# Patient Record
Sex: Male | Born: 1970
Health system: Southern US, Community
[De-identification: ages and names within clinical notes are randomized; demographics above are authoritative.]

## PROBLEM LIST (undated history)

## (undated) DIAGNOSIS — I10 Essential (primary) hypertension: Secondary | ICD-10-CM

## (undated) HISTORY — DX: Essential (primary) hypertension: I10

---

## 2010-06-06 ENCOUNTER — Encounter: Payer: Self-pay | Admitting: Emergency Medicine

## 2010-06-06 ENCOUNTER — Inpatient Hospital Stay (INDEPENDENT_AMBULATORY_CARE_PROVIDER_SITE_OTHER)
Admission: RE | Admit: 2010-06-06 | Discharge: 2010-06-06 | Disposition: A | Discharge status: Home / Self Care | Payer: 59 | Source: Ambulatory Visit | Attending: Emergency Medicine | Admitting: Emergency Medicine

## 2010-06-06 DIAGNOSIS — I10 Essential (primary) hypertension: Secondary | ICD-10-CM | POA: Insufficient documentation

## 2010-06-06 DIAGNOSIS — J069 Acute upper respiratory infection, unspecified: Secondary | ICD-10-CM

## 2010-06-06 LAB — CONVERTED CEMR LAB: Rapid Strep: NEGATIVE

## 2010-09-12 ENCOUNTER — Encounter: Payer: Self-pay | Admitting: Family Medicine

## 2010-09-15 ENCOUNTER — Ambulatory Visit (INDEPENDENT_AMBULATORY_CARE_PROVIDER_SITE_OTHER): Payer: 59 | Admitting: Family Medicine

## 2010-09-15 ENCOUNTER — Encounter: Payer: Self-pay | Admitting: Family Medicine

## 2010-09-15 VITALS — BP 160/102 | HR 79 | Ht 69.0 in | Wt 255.0 lb

## 2010-09-15 DIAGNOSIS — I10 Essential (primary) hypertension: Secondary | ICD-10-CM

## 2010-09-15 MED ORDER — BENZONATATE 200 MG PO CAPS: 200.0000 mg | ORAL_CAPSULE | Freq: Three times a day (TID) | ORAL | Status: AC | PRN

## 2010-09-15 MED ORDER — LISINOPRIL-HYDROCHLOROTHIAZIDE 10-12.5 MG PO TABS: 1.0000 | ORAL_TABLET | Freq: Every day | ORAL | Status: DC

## 2010-09-15 NOTE — Progress Notes (Signed)
Addended by: Nani Gasser D on: 09/15/2010 04:49 PM   Modules accepted: Orders

## 2010-09-15 NOTE — Assessment & Plan Note (Addendum)
New Dx.  Discussed dietary changes, exercise. Starting BP med. Warned of potential SE. Discussed DASH diet .org f/u in 1 months. Due for labs to make sure normal renal function, thyroid etc. Also need to eval chol for other risk factors.

## 2010-09-15 NOTE — Progress Notes (Signed)
  Subjective:    Patient ID: David Cole, male    DOB: [birth year], 40 y.o.   MRN: [id number]  HPI BP was up recently at [facility].  Has gained about 20 lbs in the last year. No family hx of HTN.  No HA, light headedness or dizziness. Was sick last week and did have HA then and took some Aleve.  Was taking nyquail at that time. Was on meds in the past for HTN when first moved here.  Overall feels his URI is better. Does still have a tickle in his throat and still has a slight cough. Will occ noticed a wheeze in his throat.  He has been going to school at night. Has been eating more salt last week.    Review of Systems  Constitutional: Negative for fever, diaphoresis and unexpected weight change.  HENT: Negative for hearing loss, rhinorrhea, sneezing and tinnitus.   Eyes: Negative for visual disturbance.  Respiratory: Negative for cough and wheezing.   Cardiovascular: Negative for chest pain and palpitations.  Gastrointestinal: Negative for nausea, vomiting, diarrhea and blood in stool.  Genitourinary: Negative for dysuria and discharge.  Musculoskeletal: Negative for myalgias and arthralgias.  Skin: Negative for rash.  Neurological: Negative for headaches.  Hematological: Negative for adenopathy.  Psychiatric/Behavioral: Negative for sleep disturbance and dysphoric mood. The patient is not nervous/anxious.      BP 160/102  Pulse 79  Ht 5\' 9"  (1.753 m)  Wt 255 lb (115.667 kg)  BMI 37.66 kg/m2    No Known Allergies  Past Medical History  Diagnosis Date  . Hypertension     No past surgical history on file.  History   Social History  . Marital Status: Married    Spouse Name: [name]    Number of Children: 1  . Years of Education: N/A   Occupational History  . Billing specialist [employer]   Social History Main Topics  . Smoking status: Never Smoker   . Smokeless tobacco: Not on file  . Alcohol Use: 0.0 oz/week    1-3 drink(s) per week     per week  . Drug  Use: No  . Sexually Active:    Other Topics Concern  . Not on file   Social History Narrative   1 caffeine drink per day. Some college     Family History  Problem Relation Age of Onset  . Alcohol abuse      GF    Current outpatient prescriptions:lisinopril-hydrochlorothiazide (PRINZIDE,ZESTORETIC) 10-12.5 MG per tablet, Take 1 tablet by mouth daily., Disp: 30 tablet, Rfl: 1     Objective:   Physical Exam  Constitutional: He is oriented to person, place, and time. He appears well-developed and well-nourished.  HENT:  Head: Normocephalic and atraumatic.  Cardiovascular: Normal rate and normal heart sounds.        No carotid bruits.   Pulmonary/Chest: Effort normal and breath sounds normal.  Musculoskeletal: He exhibits no edema.  Neurological: He is alert and oriented to person, place, and time.  Skin: Skin is warm and dry.  Psychiatric: He has a normal mood and affect. His behavior is normal.          Assessment & Plan:

## 2010-09-16 ENCOUNTER — Telehealth: Payer: Self-pay | Admitting: Family Medicine

## 2010-09-16 LAB — COMPLETE METABOLIC PANEL WITH GFR
Albumin: 4.6 g/dL (ref 3.5–5.2)
Alkaline Phosphatase: 83 U/L (ref 39–117)
CO2: 26 mEq/L (ref 19–32)
GFR, Est Non African American: >60 mL/min (ref >60)
Glucose, Bld: 82 mg/dL (ref 70–99)
Potassium: 4.2 mEq/L (ref 3.5–5.3)
Sodium: 138 mEq/L (ref 135–145)
Total Protein: 7.8 g/dL (ref 6.0–8.3)

## 2010-09-16 LAB — LIPID PANEL
LDL Cholesterol: 129 mg/dL — ABNORMAL HIGH (ref 0–99)
Total CHOL/HDL Ratio: 6.3 Ratio

## 2010-09-16 LAB — TSH: TSH: 1.643 u[IU]/mL (ref 0.350–4.500)

## 2010-09-16 NOTE — Telephone Encounter (Signed)
Call patient: Thyroid is normal. The liver enzymes is borderline I would recheck that in one month. Cholesterol is elevated and triglycerides are high as well. Lipitor, Effexor diet, weight loss and regular exercise and recheck cholesterol in 6 months. We can recheck her liver at that time as well.

## 2010-09-17 NOTE — Telephone Encounter (Signed)
Pt informed of his recent lab results, and important details given regarding them. Jarvis Newcomer, LPN [facility] D[facility]

## 2010-10-13 ENCOUNTER — Ambulatory Visit (INDEPENDENT_AMBULATORY_CARE_PROVIDER_SITE_OTHER): Payer: 59 | Admitting: Family Medicine

## 2010-10-13 ENCOUNTER — Encounter: Payer: Self-pay | Admitting: Family Medicine

## 2010-10-13 VITALS — BP 143/91 | HR 108 | Ht 69.0 in | Wt 253.0 lb

## 2010-10-13 DIAGNOSIS — R7989 Other specified abnormal findings of blood chemistry: Secondary | ICD-10-CM

## 2010-10-13 DIAGNOSIS — I10 Essential (primary) hypertension: Secondary | ICD-10-CM

## 2010-10-13 DIAGNOSIS — J029 Acute pharyngitis, unspecified: Secondary | ICD-10-CM

## 2010-10-13 DIAGNOSIS — E785 Hyperlipidemia, unspecified: Secondary | ICD-10-CM | POA: Insufficient documentation

## 2010-10-13 DIAGNOSIS — R945 Abnormal results of liver function studies: Secondary | ICD-10-CM

## 2010-10-13 MED ORDER — LISINOPRIL-HYDROCHLOROTHIAZIDE 20-12.5 MG PO TABS: 1.0000 | ORAL_TABLET | Freq: Every day | ORAL | Status: DC

## 2010-10-13 NOTE — Assessment & Plan Note (Signed)
Much improved. He still on the medication. He has noticed some increased urinary frequency with a diuretic. There he says at this point in time is not bothersome. We'll increase the lisinopril component to 20 mg and followup in 2 months.

## 2010-10-13 NOTE — Assessment & Plan Note (Signed)
I really want him to work on exercise and diet. He is already lost a couple pounds which I congratulated him on. I want to recheck this in about 5 months.

## 2010-10-13 NOTE — Progress Notes (Addendum)
  Subjective:    Patient ID: David Cole, male    DOB: [birth year], 40 y.o.   MRN: [id number]  Hypertension This is a chronic problem. The current episode started 1 to 4 weeks ago. The problem is uncontrolled. Pertinent negatives include no chest pain. There are no associated agents to hypertension. Past treatments include ACE inhibitors and diuretics. Compliance problems include exercise.    He also complains of mild sore throat today. He's noticed for last several days. No other URI symptoms. No fever. No cough. He did notice a white spot on his right tonsil he would like me to look at.   Review of Systems  Cardiovascular: Negative for chest pain.       Objective:   Physical Exam  Constitutional: He is oriented to person, place, and time. He appears well-developed and well-nourished.       He is an obese  HENT:  Head: Normocephalic and atraumatic.  Cardiovascular: Normal rate, regular rhythm and normal heart sounds.   Pulmonary/Chest: Effort normal and breath sounds normal.  Neurological: He is alert and oriented to person, place, and time.  Skin: Skin is warm and dry.  Psychiatric: He has a normal mood and affect. His behavior is normal.   oropharynx-mucous membranes are moist, no erythema. He does have a tonsillar stone on the right.        Assessment & Plan:  I also reviewed his lab work with him. He did have an elevated liver enzyme and he want to repeat that. He was given a lab slip today to go sometime in the next 30 days.  Mild sore throat-likely viral or allergic. He has no other constitutional symptoms. We will monitor this over the next week and I recommended symptomatic care including lozenges and zinc lozenges. He has had a tonsillar stone we discussed exactly what this is. The tonsil does not appear to be red or inflamed around the tonsillar stone. He consider using a water pick to help remove these if he gets them repeatedly. Otherwise I do not recommend trying to pick  up the tonsil to remove them as this can trigger an infection.

## 2010-10-14 NOTE — Progress Notes (Signed)
Addended by: Nani Gasser D on: 10/14/2010 07:24 AM   Modules accepted: Level of Service

## 2010-12-23 ENCOUNTER — Ambulatory Visit: Payer: 59 | Admitting: Family Medicine

## 2010-12-23 DIAGNOSIS — Z0289 Encounter for other administrative examinations: Secondary | ICD-10-CM

## 2011-01-01 ENCOUNTER — Telehealth: Payer: Self-pay | Admitting: Family Medicine

## 2011-01-01 MED ORDER — LISINOPRIL-HYDROCHLOROTHIAZIDE 20-12.5 MG PO TABS: 1.0000 | ORAL_TABLET | Freq: Every day | ORAL | Status: DC

## 2011-01-01 NOTE — Telephone Encounter (Signed)
Patient walk-in request to get a blood pressure med refill sent to [facility] in [place] on [address]

## 2011-01-15 ENCOUNTER — Encounter: Payer: Self-pay | Admitting: Family Medicine

## 2011-01-15 ENCOUNTER — Ambulatory Visit (INDEPENDENT_AMBULATORY_CARE_PROVIDER_SITE_OTHER): Payer: 59 | Admitting: Family Medicine

## 2011-01-15 DIAGNOSIS — I10 Essential (primary) hypertension: Secondary | ICD-10-CM

## 2011-01-15 MED ORDER — LISINOPRIL-HYDROCHLOROTHIAZIDE 20-12.5 MG PO TABS: 1.0000 | ORAL_TABLET | Freq: Two times a day (BID) | ORAL | Status: DC

## 2011-01-15 NOTE — Progress Notes (Signed)
  Subjective:    Patient ID: David Cole, male    DOB: 01/27/1971, 40 y.o.   MRN: [id number]  Hypertension This is a chronic problem. The current episode started more than 1 year ago. The problem is unchanged. Pertinent negatives include no chest pain, palpitations or shortness of breath. There are no associated agents to hypertension. Past treatments include ACE inhibitors and diuretics. The current treatment provides moderate improvement. There are no compliance problems.       Review of Systems  Respiratory: Negative for shortness of breath.   Cardiovascular: Negative for chest pain and palpitations.       Objective:   Physical Exam  Constitutional: He is oriented to person, place, and time. He appears well-developed and well-nourished.  HENT:  Head: Normocephalic and atraumatic.  Cardiovascular: Normal rate, regular rhythm and normal heart sounds.   Pulmonary/Chest: Effort normal and breath sounds normal.  Neurological: He is alert and oriented to person, place, and time.  Skin: Skin is warm and dry.  Psychiatric: He has a normal mood and affect. His behavior is normal.          Assessment & Plan:  Hypertension-still not well controlled today. Repeat blood pressure was slightly better. But his blood pressure is also borderline at his last office visit. I did congratulate him on his weight loss. He has lost 3 more pounds. I encouraged him to keep going and try to get at least down to the low 200s with his weight. I also reminded him to make sure he is eating a low salt diet and getting regular exercise. We will increase his lisinopril HCTZ to twice a day. Followup in one month to make sure that he is at goal. I would have printed him a new prescription today.

## 2011-02-02 NOTE — Progress Notes (Signed)
Summary: CONGESTION,FEVER/WB rm 5   Vital Signs:  Patient Profile:   40 Years Old Male CC:      congestion, fever  Height:     69 inches Weight:      255.25 pounds O2 Sat:      97 % O2 treatment:    Room Air Temp:     99.0 degrees F oral Pulse rate:   91 / minute Resp:     18 per minute BP standing:   178 / 119  (left arm) Cuff size:   large  Vitals Entered By: Clemens Catholic LPN (June 05, 1608 9:15 AM)                  Updated Prior Medication List: No Medications Current Allergies: No known allergies History of Present Illness History from: patient Chief Complaint: congestion, fever  History of Present Illness: 40 Years Old Male complains of onset of cold symptoms for 2 days.  David Cole has been using nothing OTC. +sore throat + cough No pleuritic pain No wheezing + nasal congestion + post-nasal drainage + sinus pain/pressure + chest congestion + itchy/red eyes + earache No hemoptysis No SOB No chills/sweats + fever No nausea No vomiting No abdominal pain No diarrhea No skin rashes No fatigue No myalgias No headache   REVIEW OF SYSTEMS Constitutional Symptoms       Complains of fever, chills, and night sweats.     Denies weight loss, weight gain, and fatigue.  Eyes       Complains of eye pain and glasses.      Denies change in vision, eye discharge, contact lenses, and eye surgery. Ear/Nose/Throat/Mouth       Complains of ear pain, sore throat, and hoarseness.      Denies hearing loss/aids, change in hearing, ear discharge, dizziness, frequent runny nose, frequent nose bleeds, sinus problems, and tooth pain or bleeding.  Respiratory       Complains of productive cough and wheezing.      Denies dry cough, shortness of breath, asthma, bronchitis, and emphysema/COPD.  Cardiovascular       Complains of chest pain.      Denies murmurs and tires easily with exhertion.    Gastrointestinal       Denies stomach pain, nausea/vomiting, diarrhea,  constipation, blood in bowel movements, and indigestion. Genitourniary       Denies painful urination, kidney stones, and loss of urinary control. Neurological       Denies paralysis, seizures, and fainting/blackouts. Musculoskeletal       Denies muscle pain, joint pain, joint stiffness, decreased range of motion, redness, swelling, muscle weakness, and gout.  Skin       Denies bruising, unusual mles/lumps or sores, and hair/skin or nail changes.  Psych       Denies mood changes, temper/anger issues, anxiety/stress, speech problems, depression, and sleep problems. Other Comments: pt c/o productive cough(green phlegm), RT ear ache, and fever (up to 101.8 per pt) x 2days. he has taken OTC Nyquil and Aleve with no relief.   Past History:  Past Medical History: Hypertension  Past Surgical History: Denies surgical history  Family History: none  Social History: Never Smoked Alcohol use-yes 1-3 drinks per wk Drug use-no Smoking Status:  never Drug Use:  no Physical Exam General appearance: well developed, well nourished, no acute distress Ears: normal, no lesions or deformities Nasal: clear discharge Oral/Pharynx: clear PND, no exudate, no erythema Neck: neck supple,  trachea midline, no  masses Chest/Lungs: no rales, wheezes, or rhonchi bilateral, breath sounds equal without effort Heart: regular rate and  rhythm, no murmur MSE: oriented to time, place, and person Assessment New Problems: UPPER RESPIRATORY INFECTION, ACUTE (ICD-465.9) HYPERTENSION (ICD-401.9)   Plan New Medications/Changes: ZITHROMAX Z-PAK 250 MG TABS (AZITHROMYCIN) use as directed  #1 x 0, 06/06/2010, Hoyt Koch MD  New Orders: New Patient Level III [phone] Pulse Oximetry (single measurment) [94760] T-Culture, Throat [[id number]] Rapid Strep [86578] Planning Comments:   1)  Hold antibiotic for a few days prior to taking since likely viral. Rapid strep negative.  Culture pending. 2)  Use  nasal saline solution (over the counter) at least 3 times a day. 3)  Use over the counter Zyrtec  4)  Can take tylenol every 6 hours or motrin every 8 hours for pain or fever. 5)  Follow up with your primary doctor  if no improvement in 5-7 days, sooner if increasing pain, fever, or new symptoms.   Caution with your HTN.  You need to follow up with your PCP next week to discuss your blood pressure.  ER precautions given.   The patient and/or caregiver has been counseled thoroughly with regard to medications prescribed including dosage, schedule, interactions, rationale for use, and possible side effects and they verbalize understanding.  Diagnoses and expected course of recovery discussed and will return if not improved as expected or if the condition worsens. Patient and/or caregiver verbalized understanding.  Prescriptions: ZITHROMAX Z-PAK 250 MG TABS (AZITHROMYCIN) use as directed  #1 x 0   Entered and Authorized by:   Hoyt Koch MD   Signed by:   Hoyt Koch MD on 06/06/2010   Method used:   Print then Give to Patient   RxID:   [id number]   Orders Added: 1)  New Patient Level III [10272] 2)  Pulse Oximetry (single measurment) [94760] 3)  T-Culture, Throat [[id number]] 4)  Rapid Strep [42595]    Laboratory Results  Date/Time Received: June 06, 2010 10:11 AM  Date/Time Reported: June 06, 2010 10:11 AM   Other Tests  Rapid Strep: negative  Kit Test Internal QC: Negative   (Normal Range: Negative)

## 2011-02-13 ENCOUNTER — Ambulatory Visit (INDEPENDENT_AMBULATORY_CARE_PROVIDER_SITE_OTHER): Payer: BC Managed Care – PPO | Admitting: Family Medicine

## 2011-02-13 ENCOUNTER — Encounter: Payer: Self-pay | Admitting: Family Medicine

## 2011-02-13 VITALS — BP 145/87 | HR 88 | Wt 253.0 lb

## 2011-02-13 DIAGNOSIS — M79609 Pain in unspecified limb: Secondary | ICD-10-CM

## 2011-02-13 DIAGNOSIS — M79646 Pain in unspecified finger(s): Secondary | ICD-10-CM

## 2011-02-13 DIAGNOSIS — E785 Hyperlipidemia, unspecified: Secondary | ICD-10-CM

## 2011-02-13 DIAGNOSIS — I10 Essential (primary) hypertension: Secondary | ICD-10-CM

## 2011-02-13 LAB — LIPID PANEL
LDL Cholesterol: 118 mg/dL — ABNORMAL HIGH (ref 0–99)
Triglycerides: 289 mg/dL — ABNORMAL HIGH (ref <150)
VLDL: 58 mg/dL — ABNORMAL HIGH (ref 0–40)

## 2011-02-13 MED ORDER — METOPROLOL SUCCINATE ER 25 MG PO TB24: 25.0000 mg | ORAL_TABLET | Freq: Every day | ORAL | Status: DC

## 2011-02-13 MED ORDER — LISINOPRIL-HYDROCHLOROTHIAZIDE 20-12.5 MG PO TABS: 1.0000 | ORAL_TABLET | Freq: Two times a day (BID) | ORAL | Status: DC

## 2011-02-13 NOTE — Progress Notes (Addendum)
  Subjective:    Patient ID: David Cole, male    DOB: 1970/10/10, 40 y.o.   MRN: [id number]  Hypertension This is a chronic problem. The current episode started more than 1 year ago. The problem is controlled. Pertinent negatives include no chest pain or shortness of breath. There are no associated agents to hypertension. Past treatments include ACE inhibitors and diuretics. There are no compliance problems.    Left thumb pain when he bowls. Says a couple of times during his game he will feel a pins and needles sensation in the thumb and infex finger. Also gets a nodule at the base of his thumb, palmer side. Says the nodule gets better in his off season.  He does wear a wrist brace when he bowls. The only time the tingling occurs is during bowling. Not during any other activities.   Nodule near the left elebow on the forearm.    Review of Systems  Respiratory: Negative for shortness of breath.   Cardiovascular: Negative for chest pain.       Objective:   Physical Exam  Constitutional: He is oriented to person, place, and time. He appears well-developed and well-nourished.  HENT:  Head: Normocephalic and atraumatic.  Cardiovascular: Normal rate, regular rhythm and normal heart sounds.   Pulmonary/Chest: Effort normal and breath sounds normal.  Musculoskeletal:       Fingers with NROM. He does have a nodule on the tendon at the base of hte thumb on the palmar side.  Nontender. No redness or skin changes.  Finger strength is 5/5 Right lateral forearm below the elbow is a palpable lipoma.   Neurological: He is alert and oriented to person, place, and time.  Skin: Skin is warm and dry.  Psychiatric: He has a normal mood and affect. His behavior is normal.          Assessment & Plan:  HTN - Not well controlled. Add Toprol to regimen. F/U in 2 months.  If well controlled at that point will follow Q92mo. Due to recheck liver and Cholesterol.   Hyperlipidemia - Due to recheck lipids.    Left thumb nodule - Will refer to sports med. Maybe a tendon cyst or a ganglion cyst.  It only bothers him during bowling which is reassuring.  Pain is so temporary that NSAIDs are not needed.    Lipoma on the left forearm. Gave reassurance. If bothersome can refer for removal.

## 2011-02-14 LAB — COMPLETE METABOLIC PANEL WITH GFR
ALT: 44 U/L (ref 0–53)
AST: 26 U/L (ref 0–37)
Albumin: 4.8 g/dL (ref 3.5–5.2)
CO2: 28 mEq/L (ref 19–32)
Calcium: 9.6 mg/dL (ref 8.4–10.5)
Chloride: 103 mEq/L (ref 96–112)
Creat: 1.07 mg/dL (ref 0.50–1.35)
Potassium: 4.2 mEq/L (ref 3.5–5.3)
Sodium: 140 mEq/L (ref 135–145)
Total Protein: 7.3 g/dL (ref 6.0–8.3)

## 2011-04-15 ENCOUNTER — Encounter: Payer: Self-pay | Admitting: *Deleted

## 2011-04-24 ENCOUNTER — Ambulatory Visit: Payer: BC Managed Care – PPO | Admitting: Family Medicine

## 2011-04-24 DIAGNOSIS — Z0289 Encounter for other administrative examinations: Secondary | ICD-10-CM

## 2011-08-03 ENCOUNTER — Other Ambulatory Visit: Payer: Self-pay | Admitting: Family Medicine

## 2011-11-09 ENCOUNTER — Other Ambulatory Visit: Payer: Self-pay | Admitting: Family Medicine

## 2011-11-11 ENCOUNTER — Ambulatory Visit: Payer: BC Managed Care – PPO | Admitting: Family Medicine

## 2011-11-16 ENCOUNTER — Ambulatory Visit: Payer: BC Managed Care – PPO | Admitting: Family Medicine

## 2012-01-05 ENCOUNTER — Other Ambulatory Visit: Payer: Self-pay | Admitting: Family Medicine

## 2012-01-06 ENCOUNTER — Other Ambulatory Visit: Payer: Self-pay | Admitting: Family Medicine

## 2012-01-07 ENCOUNTER — Other Ambulatory Visit: Payer: Self-pay | Admitting: *Deleted

## 2012-01-07 NOTE — Telephone Encounter (Signed)
Must make appointment before any further refills 

## 2012-02-18 ENCOUNTER — Other Ambulatory Visit: Payer: Self-pay | Admitting: *Deleted

## 2012-02-18 MED ORDER — METOPROLOL SUCCINATE ER 25 MG PO TB24: 25.0000 mg | ORAL_TABLET | Freq: Every day | ORAL | Status: DC

## 2012-02-18 MED ORDER — LISINOPRIL-HYDROCHLOROTHIAZIDE 20-12.5 MG PO TABS: 1.0000 | ORAL_TABLET | Freq: Two times a day (BID) | ORAL | Status: DC

## 2012-02-22 ENCOUNTER — Ambulatory Visit (INDEPENDENT_AMBULATORY_CARE_PROVIDER_SITE_OTHER): Payer: BC Managed Care – PPO | Admitting: Physician Assistant

## 2012-02-22 ENCOUNTER — Encounter: Payer: Self-pay | Admitting: Physician Assistant

## 2012-02-22 VITALS — BP 153/97 | HR 103 | Ht 69.0 in | Wt 258.0 lb

## 2012-02-22 DIAGNOSIS — Z131 Encounter for screening for diabetes mellitus: Secondary | ICD-10-CM

## 2012-02-22 DIAGNOSIS — E785 Hyperlipidemia, unspecified: Secondary | ICD-10-CM

## 2012-02-22 DIAGNOSIS — I10 Essential (primary) hypertension: Secondary | ICD-10-CM

## 2012-02-22 LAB — COMPREHENSIVE METABOLIC PANEL
AST: 37 U/L (ref 0–37)
Albumin: 4.6 g/dL (ref 3.5–5.2)
BUN: 11 mg/dL (ref 6–23)
CO2: 27 mEq/L (ref 19–32)
Calcium: 9.9 mg/dL (ref 8.4–10.5)
Chloride: 101 mEq/L (ref 96–112)
Creat: 1.13 mg/dL (ref 0.50–1.35)
Glucose, Bld: 87 mg/dL (ref 70–99)

## 2012-02-22 LAB — LIPID PANEL
Cholesterol: 231 mg/dL — ABNORMAL HIGH (ref 0–200)
HDL: 38 mg/dL — ABNORMAL LOW (ref >39)

## 2012-02-22 MED ORDER — LISINOPRIL-HYDROCHLOROTHIAZIDE 20-12.5 MG PO TABS: 1.0000 | ORAL_TABLET | Freq: Two times a day (BID) | ORAL | Status: DC

## 2012-02-22 MED ORDER — METOPROLOL SUCCINATE ER 25 MG PO TB24: ORAL_TABLET | ORAL | Status: DC

## 2012-02-22 NOTE — Patient Instructions (Addendum)
REfill Zestoretic same dose. Toprol increase 50mg  in the morning.   Low salt and exercise.  Sodium-Controlled Diet Sodium is a mineral. It is found in many foods. Sodium may be found naturally or added during the making of a food. The most common form of sodium is salt, which is made up of sodium and chloride. Reducing your sodium intake involves changing your eating habits. The following guidelines will help you reduce the sodium in your diet:  Stop using the salt shaker.  Use salt sparingly in cooking and baking.  Substitute with sodium-free seasonings and spices.  Do not use a salt substitute (potassium chloride) without your caregiver's permission.  Include a variety of fresh, unprocessed foods in your diet.  Limit the use of processed and convenience foods that are high in sodium. USE THE FOLLOWING FOODS SPARINGLY: Breads/Starches  Commercial bread stuffing, commercial pancake or waffle mixes, coating mixes. Waffles. Croutons. Prepared (boxed or frozen) potato, rice, or noodle mixes that contain salt or sodium. Salted [place] fries or hash browns. Salted popcorn, breads, crackers, chips, or snack foods. Vegetables  Vegetables canned with salt or prepared in cream, butter, or cheese sauces. Sauerkraut. Tomato or vegetable juices canned with salt.  Fresh vegetables are allowed if rinsed thoroughly. Fruit  Fruit is okay to eat. Meat and Meat Substitutes  Salted or smoked meats, such as bacon or Canadian bacon, chipped or corned beef, hot dogs, salt pork, luncheon meats, pastrami, ham, or sausage. Canned or smoked fish, poultry, or meat. Processed cheese or cheese spreads, blue or Roquef[place] cheese. Battered or frozen fish products. Prepared spaghetti sauce. Baked beans. Reuben sandwiches. Salted nuts. Caviar. Milk  Limit buttermilk to 1 cup per week. Soups and Combination Foods  Bouillon cubes, canned or dried soups, broth, consomm. Convenience (frozen or packaged) dinners  with more than 600 mg sodium. Pot pies, pizza, Asian food, fast food cheeseburgers, and specialty sandwiches. Desserts and Sweets  Regular (salted) desserts, pie, commercial fruit snack pies, commercial snack cakes, canned puddings.  Eat desserts and sweets in moderation. Fats and Oils  Gravy mixes or canned gravy. No more than 1 to 2 tbs of salad dressing. Chip dips.  Eat fats and oils in moderation. Beverages  See those listed under the vegetables and milk groups. Condiments  Ketchup, mustard, meat sauces, salsa, regular (salted) and lite soy sauce or mustard. Dill pickles, olives, meat tenderizer. Prepared horseradish or pickle relish. Dutch-processed cocoa. Baking powder or baking soda used medicinally. Worcestershire sauce. "Light" salt. Salt substitute, unless approved by your caregiver. Document Released: 08/08/2001 Document Revised: 05/11/2011 Document Reviewed: 03/11/2009 [facility] Patient Information 2013 [place], [place].

## 2012-02-22 NOTE — Progress Notes (Signed)
  Subjective:    Patient ID: David Cole, male    DOB: 1970/10/31, 41 y.o.   MRN: [id number]  Hypertension This is a chronic problem. The current episode started more than 1 year ago. The problem has been gradually worsening since onset. The problem is uncontrolled. Pertinent negatives include no anxiety, blurred vision, chest pain, headaches, malaise/fatigue, neck pain, orthopnea, palpitations, peripheral edema, PND, shortness of breath or sweats. There are no associated agents to hypertension. Risk factors for coronary artery disease include family history, dyslipidemia, male gender, obesity and sedentary lifestyle. Past treatments include ACE inhibitors, beta blockers and lifestyle changes. The current treatment provides moderate improvement. There are no compliance problems.  There is no history of angina, kidney disease, CVA, heart failure, PVD or retinopathy.      Review of Systems  Constitutional: Negative for malaise/fatigue.  HENT: Negative for neck pain.   Eyes: Negative for blurred vision.  Respiratory: Negative for shortness of breath.   Cardiovascular: Negative for chest pain, palpitations, orthopnea and PND.  Neurological: Negative for headaches.       Objective:   Physical Exam  Constitutional: He is oriented to person, place, and time. He appears well-developed and well-nourished.       Obesity.  HENT:  Head: Normocephalic and atraumatic.  Cardiovascular: Regular rhythm and normal heart sounds.        Tachycardia at 103.   Pulmonary/Chest: Effort normal and breath sounds normal. He has no wheezes.  Neurological: He is alert and oriented to person, place, and time.  Skin: Skin is warm and dry.  Psychiatric: He has a normal mood and affect. His behavior is normal.          Assessment & Plan:  HTN- Already at max dose of Zestoretic will keep at same dose and refill. INcreased Toprol to 50mg  daily. Will recheck BP in 2 weeks. Think about making diet changes that are  low in salt and including regular exercise. This will make a bigger difference than you think in BP. Discussed long term side effects of not controlling you BP. Will check CmP today.  Hyperlipidemia- Gave lab slip to get lipids checked. He never started Fish oil tabs last year.

## 2012-03-07 ENCOUNTER — Encounter: Payer: Self-pay | Admitting: Physician Assistant

## 2012-03-07 ENCOUNTER — Ambulatory Visit (INDEPENDENT_AMBULATORY_CARE_PROVIDER_SITE_OTHER): Payer: BC Managed Care – PPO | Admitting: Physician Assistant

## 2012-03-07 VITALS — BP 148/96 | HR 85 | Wt 256.0 lb

## 2012-03-07 DIAGNOSIS — I10 Essential (primary) hypertension: Secondary | ICD-10-CM

## 2012-03-07 MED ORDER — METOPROLOL SUCCINATE ER 100 MG PO TB24: 100.0000 mg | ORAL_TABLET | Freq: Every day | ORAL | Status: DC

## 2012-03-07 NOTE — Patient Instructions (Addendum)
Stay on Lisinopril and increase Toprol XL to 100mg  daily.   Goal BP is 120/80.   Sodium-Controlled Diet Sodium is a mineral. It is found in many foods. Sodium may be found naturally or added during the making of a food. The most common form of sodium is salt, which is made up of sodium and chloride. Reducing your sodium intake involves changing your eating habits. The following guidelines will help you reduce the sodium in your diet:  Stop using the salt shaker.  Use salt sparingly in cooking and baking.  Substitute with sodium-free seasonings and spices.  Do not use a salt substitute (potassium chloride) without your caregiver's permission.  Include a variety of fresh, unprocessed foods in your diet.  Limit the use of processed and convenience foods that are high in sodium. USE THE FOLLOWING FOODS SPARINGLY: Breads/Starches  Commercial bread stuffing, commercial pancake or waffle mixes, coating mixes. Waffles. Croutons. Prepared (boxed or frozen) potato, rice, or noodle mixes that contain salt or sodium. Salted [place] fries or hash browns. Salted popcorn, breads, crackers, chips, or snack foods. Vegetables  Vegetables canned with salt or prepared in cream, butter, or cheese sauces. Sauerkraut. Tomato or vegetable juices canned with salt.  Fresh vegetables are allowed if rinsed thoroughly. Fruit  Fruit is okay to eat. Meat and Meat Substitutes  Salted or smoked meats, such as bacon or Canadian bacon, chipped or corned beef, hot dogs, salt pork, luncheon meats, pastrami, ham, or sausage. Canned or smoked fish, poultry, or meat. Processed cheese or cheese spreads, blue or [place]ef[place] cheese. Battered or frozen fish products. Prepared spaghetti sauce. Baked beans. Reuben sandwiches. Salted nuts. Caviar. Milk  Limit buttermilk to 1 cup per week. Soups and Combination Foods  Bouillon cubes, canned or dried soups, broth, consomm. Convenience (frozen or packaged) dinners with more than  600 mg sodium. Pot pies, pizza, Asian food, fast food cheeseburgers, and specialty sandwiches. Desserts and Sweets  Regular (salted) desserts, pie, commercial fruit snack pies, commercial snack cakes, canned puddings.  Eat desserts and sweets in moderation. Fats and Oils  Gravy mixes or canned gravy. No more than 1 to 2 tbs of salad dressing. Chip dips.  Eat fats and oils in moderation. Beverages  See those listed under the vegetables and milk groups. Condiments  Ketchup, mustard, meat sauces, salsa, regular (salted) and lite soy sauce or mustard. Dill pickles, olives, meat tenderizer. Prepared horseradish or pickle relish. Dutch-processed cocoa. Baking powder or baking soda used medicinally. Worcestershire sauce. "Light" salt. Salt substitute, unless approved by your caregiver. Document Released: 08/08/2001 Document Revised: 05/11/2011 Document Reviewed: 03/11/2009 [facility] Patient Information 2013 [place], [place].

## 2012-03-07 NOTE — Progress Notes (Signed)
  Subjective:    Patient ID: David Cole, male    DOB: October 13, 1970, 42 y.o.   MRN: [id number]  Hypertension This is a chronic problem. The current episode started more than 1 year ago. The problem has been gradually worsening since onset. The problem is uncontrolled. Pertinent negatives include no anxiety, blurred vision, chest pain, headaches, malaise/fatigue, neck pain, orthopnea, palpitations, peripheral edema, PND, shortness of breath or sweats. Risk factors for coronary artery disease include dyslipidemia, obesity and male gender. Past treatments include ACE inhibitors, beta blockers and diuretics. The current treatment provides mild improvement. Compliance problems include diet.  There is no history of angina, kidney disease, CAD/MI, left ventricular hypertrophy, renovascular disease, retinopathy or a thyroid problem. There is no history of chronic renal disease.   Patient is taking Zestoretic and metoprolol as directed.   Review of Systems  Constitutional: Negative for malaise/fatigue.  HENT: Negative for neck pain.   Eyes: Negative for blurred vision.  Respiratory: Negative for shortness of breath.   Cardiovascular: Negative for chest pain, palpitations, orthopnea and PND.  Neurological: Negative for headaches.       Objective:   Physical Exam  Constitutional: He is oriented to person, place, and time. He appears well-developed and well-nourished.  HENT:  Head: Normocephalic and atraumatic.  Cardiovascular: Normal rate, regular rhythm and normal heart sounds.   Pulmonary/Chest: Effort normal and breath sounds normal. He has no wheezes.  Neurological: He is alert and oriented to person, place, and time.  Skin: Skin is warm and dry.  Psychiatric: He has a normal mood and affect. His behavior is normal.          Assessment & Plan:  HTN- will keep patient on Zestoretic increase Toprol to 100 mg daily. Patient did report some increased feelings of cold when starting the Toprol.  Patient about the side effects worsen or continue to call office and we can consider adding a different medication to therapy. Patient reports she will get a blood pressure cuff to start taking his blood pressures. Goal is 120/80. We'll recheck in office in 4 weeks. Patient was reminded of low salt diet and importance of regular exercise. I believe this patient would set a goal of losing 5 pounds this month his blood pressure would decrease with weight loss alone.

## 2012-04-04 ENCOUNTER — Ambulatory Visit: Payer: BC Managed Care – PPO | Admitting: Physician Assistant

## 2012-04-04 DIAGNOSIS — Z0289 Encounter for other administrative examinations: Secondary | ICD-10-CM

## 2012-05-16 ENCOUNTER — Other Ambulatory Visit: Payer: Self-pay

## 2012-05-16 DIAGNOSIS — I10 Essential (primary) hypertension: Secondary | ICD-10-CM

## 2012-05-16 MED ORDER — LISINOPRIL-HYDROCHLOROTHIAZIDE 20-12.5 MG PO TABS: 1.0000 | ORAL_TABLET | Freq: Two times a day (BID) | ORAL | Status: DC

## 2012-05-16 MED ORDER — METOPROLOL SUCCINATE ER 100 MG PO TB24: 100.0000 mg | ORAL_TABLET | Freq: Every day | ORAL | Status: DC

## 2012-05-20 ENCOUNTER — Ambulatory Visit (INDEPENDENT_AMBULATORY_CARE_PROVIDER_SITE_OTHER): Payer: BC Managed Care – PPO | Admitting: Family Medicine

## 2012-05-20 ENCOUNTER — Encounter: Payer: Self-pay | Admitting: Family Medicine

## 2012-05-20 VITALS — BP 141/89 | HR 68 | Ht 69.0 in | Wt 258.0 lb

## 2012-05-20 DIAGNOSIS — I10 Essential (primary) hypertension: Secondary | ICD-10-CM

## 2012-05-20 DIAGNOSIS — J309 Allergic rhinitis, unspecified: Secondary | ICD-10-CM

## 2012-05-20 MED ORDER — OLMESARTAN-AMLODIPINE-HCTZ 40-5-25 MG PO TABS: 1.0000 | ORAL_TABLET | Freq: Every day | ORAL | Status: DC

## 2012-05-20 NOTE — Progress Notes (Signed)
  Subjective:    Patient ID: David Cole, male    DOB: Oct 17, 1970, 42 y.o.   MRN: [id number]  HPI HTN -  Pt denies chest pain, SOB, dizziness, or heart palpitations.  Taking meds as directed w/o problems.  Denies medication side effects.  Increased his toprol 100mg  back in Jan. Says feels phyisically better when takes her medication. Has been walking for exercise.   Has had some mild nasal congestion over the last couple days. He says he is getting some postnasal drip with coughing and occasional cough. Otherwise no sore throat ear pain or pressure. He thinks he might have some allergies but is not 100% sure. Has not tried anything over-the-counter for this. No fever.  Review of Systems     Objective:   Physical Exam  Constitutional: He is oriented to person, place, and time. He appears well-developed and well-nourished.  HENT:  Head: Normocephalic and atraumatic.  Cardiovascular: Normal rate, regular rhythm and normal heart sounds.   Pulmonary/Chest: Effort normal and breath sounds normal.  Neurological: He is alert and oriented to person, place, and time.  Skin: Skin is warm and dry.  Psychiatric: He has a normal mood and affect. His behavior is normal.          Assessment & Plan:  HTN - almost at goal.  Will change to tribenzor and continue the metoprolol.  Follow up in 6 weeks. He could invest in a blood pressure cuff. I recommended a couple of branches him. I encouraged him to bring in at the next office visit to compare to our machine. That we can verify accuracy. If it's not accurate he can take it back if he keeps the receipt. I explained to him that once we get him well controlled I will only need to see him twice a year. Check BMP at followup after adjusting his medication.  Allergic rhinitis-recommend a trial of an over-the-counter antihistamine such as Claritin or Allegra. Recommend avoid the decongestant because of his blood pressure. If he feels she's getting worse or his  symptoms are returning more into a sinus infection then please give [place] a call back.

## 2012-05-20 NOTE — Patient Instructions (Addendum)
Recommend he take the Tribenzor in the morning and takes her metoprolol at bedtime. I would like to see you back in 6 weeks to see if her blood pressures improving. Encourage you to work on increasing her intensity of your exercise routine and encourage you to extend your exercise time to 30 minutes, 5 days per week. If you decide to purchase a blood pressure cuff recommend that you bring in with you at your followup visit so that we can verify the accuracy of the machine. Keep your receipt and continued recurrent.

## 2012-06-13 ENCOUNTER — Other Ambulatory Visit: Payer: Self-pay | Admitting: *Deleted

## 2012-06-13 DIAGNOSIS — I10 Essential (primary) hypertension: Secondary | ICD-10-CM

## 2012-06-13 MED ORDER — METOPROLOL SUCCINATE ER 100 MG PO TB24: 100.0000 mg | ORAL_TABLET | Freq: Every day | ORAL | Status: DC

## 2012-07-01 ENCOUNTER — Ambulatory Visit: Payer: BC Managed Care – PPO | Admitting: Family Medicine

## 2012-07-05 ENCOUNTER — Encounter: Payer: Self-pay | Admitting: Family Medicine

## 2012-07-05 ENCOUNTER — Ambulatory Visit (INDEPENDENT_AMBULATORY_CARE_PROVIDER_SITE_OTHER): Payer: BC Managed Care – PPO | Admitting: Family Medicine

## 2012-07-05 VITALS — BP 134/90 | HR 74 | Wt 257.0 lb

## 2012-07-05 DIAGNOSIS — M25579 Pain in unspecified ankle and joints of unspecified foot: Secondary | ICD-10-CM

## 2012-07-05 DIAGNOSIS — I10 Essential (primary) hypertension: Secondary | ICD-10-CM

## 2012-07-05 NOTE — Progress Notes (Signed)
  Subjective:    Patient ID: David Cole, male    DOB: Jan 07, 1971, 42 y.o.   MRN: [id number]  HPI HTN- has been walking for the last 2 weeks.   Pt denies chest pain, SOB, dizziness, or heart palpitations.  Taking meds as directed w/o problems.  Denies medication side effects. Says did get some blisters from walking yesterday.    Review of Systems     Objective:   Physical Exam  Constitutional: He is oriented to person, place, and time. He appears well-developed and well-nourished.  HENT:  Head: Normocephalic and atraumatic.  Cardiovascular: Normal rate, regular rhythm and normal heart sounds.   Pulmonary/Chest: Effort normal and breath sounds normal.  Musculoskeletal: He exhibits no edema.  Neurological: He is alert and oriented to person, place, and time.  Skin: Skin is warm and dry.  Psychiatric: He has a normal mood and affect. His behavior is normal.    He did have a bruised area on the ball of his foot on the left. No actual blister yet.      Assessment & Plan:  HTN- Almost at goal. F/U in 3 months. The diastolic is just borderline so do want to get this down. If he could lose about 10-15 pounds would make a huge difference in his blood pressures. Followup in September. Due for BMP today since we did make adjustments to his regimen but he is tolerating it well. He did feel dizzy initially but that has resolved.  Foot pain-he did have a looks like a bruise on the ball of the foot. We discussed getting maybe some new shoe wear for walking and if the discomfort in that area continues then followup for further evaluation. He might be a good candidate for metatarsal cookies.

## 2012-07-06 LAB — BASIC METABOLIC PANEL WITH GFR
CO2: 27 mEq/L (ref 19–32)
Chloride: 99 mEq/L (ref 96–112)
GFR, Est Non African American: >89 mL/min
Potassium: 3.9 mEq/L (ref 3.5–5.3)
Sodium: 138 mEq/L (ref 135–145)

## 2012-07-06 NOTE — Progress Notes (Signed)
Quick Note:  All labs are normal. ______ 

## 2012-07-23 ENCOUNTER — Other Ambulatory Visit: Payer: Self-pay | Admitting: Family Medicine

## 2012-10-17 ENCOUNTER — Other Ambulatory Visit: Payer: Self-pay | Admitting: Family Medicine

## 2012-11-07 ENCOUNTER — Ambulatory Visit: Payer: BC Managed Care – PPO | Admitting: Family Medicine

## 2012-11-07 DIAGNOSIS — Z0289 Encounter for other administrative examinations: Secondary | ICD-10-CM

## 2012-11-22 ENCOUNTER — Other Ambulatory Visit: Payer: Self-pay | Admitting: Family Medicine

## 2012-11-28 ENCOUNTER — Other Ambulatory Visit: Payer: Self-pay | Admitting: Family Medicine

## 2012-12-24 ENCOUNTER — Other Ambulatory Visit: Payer: Self-pay | Admitting: Family Medicine

## 2012-12-26 NOTE — Telephone Encounter (Signed)
Needs appt before future refills

## 2013-01-10 ENCOUNTER — Other Ambulatory Visit: Payer: Self-pay | Admitting: Family Medicine

## 2013-01-17 ENCOUNTER — Other Ambulatory Visit: Payer: Self-pay | Admitting: Family Medicine

## 2013-01-22 ENCOUNTER — Other Ambulatory Visit: Payer: Self-pay | Admitting: Family Medicine

## 2013-01-23 ENCOUNTER — Encounter: Payer: Self-pay | Admitting: Family Medicine

## 2013-01-23 ENCOUNTER — Telehealth: Payer: Self-pay | Admitting: *Deleted

## 2013-01-23 ENCOUNTER — Ambulatory Visit (INDEPENDENT_AMBULATORY_CARE_PROVIDER_SITE_OTHER): Payer: BC Managed Care – PPO | Admitting: Family Medicine

## 2013-01-23 VITALS — BP 117/78 | HR 100 | Temp 99.0°F | Wt 265.0 lb

## 2013-01-23 DIAGNOSIS — E785 Hyperlipidemia, unspecified: Secondary | ICD-10-CM

## 2013-01-23 DIAGNOSIS — I10 Essential (primary) hypertension: Secondary | ICD-10-CM

## 2013-01-23 MED ORDER — METOPROLOL SUCCINATE ER 100 MG PO TB24: ORAL_TABLET | ORAL | Status: DC

## 2013-01-23 MED ORDER — OLMESARTAN-AMLODIPINE-HCTZ 40-5-25 MG PO TABS: ORAL_TABLET | ORAL | Status: DC

## 2013-01-23 NOTE — Telephone Encounter (Signed)
Tried to contact pt to inform him that we would only be sending 2 week supply of medication to his pharmacy due to him needing a f/u. Number has been disconnected. Will only send 2 week supply tom pharmacy.  Meyer Cory, LPN

## 2013-01-23 NOTE — Progress Notes (Signed)
  Subjective:    Patient ID: David Cole, male    DOB: Mar 23, 1970, 42 y.o.   MRN: [id number]  HPI HTN - he ran out of metoprolol he started taking lisinopril/HCTZ with the Trinbenzor.   Pt denies chest pain, SOB, dizziness, or heart palpitations.  Taking meds as directed w/o problems.  Denies medication side effects.    Hyperlipidemia - not on medication. Has gained weight. Says he knows he needs to loose weight. No regular exercise.  Review of Systems     Objective:   Physical Exam  Constitutional: He is oriented to person, place, and time. He appears well-developed and well-nourished.  HENT:  Head: Normocephalic and atraumatic.  Cardiovascular: Normal rate, regular rhythm and normal heart sounds.   Pulmonary/Chest: Effort normal and breath sounds normal.  Neurological: He is alert and oriented to person, place, and time.  Skin: Skin is warm and dry.  Psychiatric: He has a normal mood and affect. His behavior is normal.          Assessment & Plan:  HTN- Well controlled.  Will refill meds for Tribenzor and 4 metoprolol. Follow up in 6 months. Due for CMP and lipid panel next month. Print lab slip that he can go next month fasting.  Hyperlpidemia - due to recheck lipids.

## 2013-06-13 ENCOUNTER — Ambulatory Visit (INDEPENDENT_AMBULATORY_CARE_PROVIDER_SITE_OTHER): Payer: BC Managed Care – PPO | Admitting: Family Medicine

## 2013-06-13 ENCOUNTER — Encounter: Payer: Self-pay | Admitting: Family Medicine

## 2013-06-13 VITALS — BP 127/83 | HR 84 | Ht 69.0 in | Wt 265.0 lb

## 2013-06-13 DIAGNOSIS — L909 Atrophic disorder of skin, unspecified: Secondary | ICD-10-CM

## 2013-06-13 DIAGNOSIS — L918 Other hypertrophic disorders of the skin: Secondary | ICD-10-CM

## 2013-06-13 DIAGNOSIS — Z6839 Body mass index (BMI) 39.0-39.9, adult: Secondary | ICD-10-CM

## 2013-06-13 DIAGNOSIS — H1045 Other chronic allergic conjunctivitis: Secondary | ICD-10-CM

## 2013-06-13 DIAGNOSIS — H1010 Acute atopic conjunctivitis, unspecified eye: Secondary | ICD-10-CM

## 2013-06-13 DIAGNOSIS — L919 Hypertrophic disorder of the skin, unspecified: Secondary | ICD-10-CM

## 2013-06-13 LAB — COMPLETE METABOLIC PANEL WITH GFR
ALT: 57 U/L — ABNORMAL HIGH (ref 0–53)
AST: 34 U/L (ref 0–37)
Albumin: 4.6 g/dL (ref 3.5–5.2)
Alkaline Phosphatase: 75 U/L (ref 39–117)
BUN: 16 mg/dL (ref 6–23)
CO2: 27 mEq/L (ref 19–32)
Calcium: 9.8 mg/dL (ref 8.4–10.5)
Chloride: 101 mEq/L (ref 96–112)
Creat: 1.11 mg/dL (ref 0.50–1.35)
GFR, Est African American: >89 mL/min
GFR, Est Non African American: 81 mL/min
Glucose, Bld: 85 mg/dL (ref 70–99)
Potassium: 4.4 mEq/L (ref 3.5–5.3)
Sodium: 138 mEq/L (ref 135–145)
Total Bilirubin: 0.7 mg/dL (ref 0.2–1.2)
Total Protein: 7.9 g/dL (ref 6.0–8.3)

## 2013-06-13 LAB — LIPID PANEL
Cholesterol: 198 mg/dL (ref 0–200)
HDL: 32 mg/dL — ABNORMAL LOW (ref >39)
LDL Cholesterol: 111 mg/dL — ABNORMAL HIGH (ref 0–99)
Total CHOL/HDL Ratio: 6.2 Ratio
Triglycerides: 273 mg/dL — ABNORMAL HIGH (ref <150)
VLDL: 55 mg/dL — ABNORMAL HIGH (ref 0–40)

## 2013-06-13 MED ORDER — METOPROLOL SUCCINATE ER 100 MG PO TB24: ORAL_TABLET | ORAL | Status: DC

## 2013-06-13 MED ORDER — OLOPATADINE HCL 0.2 % OP SOLN: 1.0000 [drp] | Freq: Every day | OPHTHALMIC | Status: DC

## 2013-06-13 MED ORDER — OLMESARTAN-AMLODIPINE-HCTZ 40-5-25 MG PO TABS: ORAL_TABLET | ORAL | Status: DC

## 2013-06-13 NOTE — Patient Instructions (Signed)
Can remove bandages tomorrow and take a regular shower. Pat dry.  Call if any sign of infection,etc

## 2013-06-13 NOTE — Progress Notes (Signed)
   Subjective:    Patient ID: David Cole, male    DOB: Jul 21, 1970, 43 y.o.   MRN: [id number]  HPI Here for skin tag removal. He has several under his axilla bilaterally. One on his left eyelid and a few around his waistline that he would like removed. There is taking. He said that the one under his left axilla became infected last week but has started healing on its own.  Obesity-he would like to discuss using she seemed to help him lose weight. He is also interested in working with a nutritionist.   He's also had red irritated eyes for the last few days. Is not currently using any allergy treatment.   Review of Systems     Objective:   Physical Exam  Constitutional: He is oriented to person, place, and time. He appears well-developed and well-nourished.  HENT:  Head: Normocephalic and atraumatic.  bilat sclera are injected.   Eyes: Conjunctivae are normal. Pupils are equal, round, and reactive to light.  Neurological: He is alert and oriented to person, place, and time.  Skin: Skin is warm and dry.  Psychiatric: He has a normal mood and affect. His behavior is normal.          Assessment & Plan:  Obesity/BMI 39- we discussed different options. I think that using a fine. Does get a lot of vegetables and decided that he would not normally eat. We did discuss making sure he is getting an adequate protein. Also will refer to nutritionist here at [facility] [place]. He wanted to be followed carefully for his blood pressure with rapid weight loss so we can adjust his medication as tolerated.  Hypertension-he will followup in one to 2 weeks after he starts his diet plan to make sure that his blood pressures not dropping too low.  Skin tag removal-see note for procedure blood.  Allergic conjunctivitis-will treat with a day.    Skin tag removal.  Subjective:     David Cole is a 43 y.o. male who complains of skin tags. The patient wishes skin tag removed as the lesion  is getting caught on clothing and/or jewelry and is recurrently irritated.   The following portions of the patient's history were reviewed and updated as appropriate: allergies, current medications, past family history, past medical history, past social history, past surgical history and problem list.  Review of Systems Pertinent items are noted in HPI.    Objective:    Skin:  15 skin tag in the eyelid, axillary region and waist line region, were removed using scissors and forceps after alcohol prep; hemostasis is obtained with aluminum chloride and discarded.    Assessment:    Chronically irritated skin tag, removed.    Plan:     1. The patient is instructed to watch for signs of infection including erythema, pain,      purulent discharge, or crusting.  2. Verbal patient instruction given. 3. Follow up as needed for acute illness.

## 2013-06-21 ENCOUNTER — Ambulatory Visit: Payer: BC Managed Care – PPO | Admitting: Family Medicine

## 2013-06-21 DIAGNOSIS — Z0289 Encounter for other administrative examinations: Secondary | ICD-10-CM

## 2013-07-11 ENCOUNTER — Ambulatory Visit (INDEPENDENT_AMBULATORY_CARE_PROVIDER_SITE_OTHER): Payer: BC Managed Care – PPO | Admitting: Family Medicine

## 2013-07-11 ENCOUNTER — Encounter: Payer: Self-pay | Admitting: Family Medicine

## 2013-07-11 VITALS — BP 134/82 | HR 96 | Ht 69.0 in | Wt 266.0 lb

## 2013-07-11 DIAGNOSIS — M25562 Pain in left knee: Secondary | ICD-10-CM

## 2013-07-11 DIAGNOSIS — M25569 Pain in unspecified knee: Secondary | ICD-10-CM

## 2013-07-11 DIAGNOSIS — I10 Essential (primary) hypertension: Secondary | ICD-10-CM

## 2013-07-11 NOTE — Patient Instructions (Signed)
Buy a knee sleeve at [facility].  Continue Aleve twice a day. Make sure take food and water.   Continue to ice as needed.  Work on gentle stretching.

## 2013-07-11 NOTE — Progress Notes (Signed)
   Subjective:    Patient ID: David Cole, male    DOB: [birth year], 43 y.o.   MRN: [id number]  HPI Left knee pain x 3 days.  Says it feels sore and swollen.  Day before the knee started hurting was lifting furniture helping hsi parents move. He did not feel any sudden pops or pain. In fact it started hurting the evening after work.  Has been icing it some and using Aleve.  No popping or locking.  No  Old injuries.    Just started his exercise and diet prgram with jiucing yesterday.  He really wants to work on losing weight. In fact he was supposed to start his diet 2 weeks ago and I was having incontinence today just to make sure that his blood pressure was dropping too low to quickly  Hypertension- Pt denies chest pain, SOB, dizziness, or heart palpitations.  Taking meds as directed w/o problems.  Denies medication side effects.     Review of Systems     Objective:   Physical Exam  Constitutional: He is oriented to person, place, and time. He appears well-developed and well-nourished.  HENT:  Head: Normocephalic and atraumatic.  Abdominal: Soft. Bowel sounds are normal.  Musculoskeletal:  Left knee with NROM but some pain with full flexion.  Slihtly tender just above the patella.  Non tender over the joint lines or the patella. Increased warmth. Trace edema. Normal anterior drawer test. No increased laxity.   Neurological: He is alert and oriented to person, place, and time.  Skin: Skin is warm and dry.  Psychiatric: He has a normal mood and affect. His behavior is normal.          Assessment & Plan:  HTN- well controlled. Continue to monitor as he works on his weight loss regimen.  Left knee pain - possible cartilage tear. It like to start with conservative treatment. Recommend compression sleeve for comfort and compression. Elevate and ice. If not improving over the next 2-3 weeks and I would like for him to see or sports medicine doctor for further evaluation. As noted increased  laxity of the joint. Recommend anti-inflammatory over-the-counter such as Aleve. Make sure take with food and water and stop immediately if any GI upset or irritation.

## 2013-07-14 ENCOUNTER — Telehealth: Payer: Self-pay

## 2013-07-14 DIAGNOSIS — M25569 Pain in unspecified knee: Secondary | ICD-10-CM

## 2013-07-14 NOTE — Telephone Encounter (Signed)
referal placed

## 2013-07-14 NOTE — Telephone Encounter (Signed)
David Cole states the swelling has resolved in his knee. He still has severe pain in his knee and is having a hard time ambulating. He wants to be referred to [facility] for his knee pain as soon as possible. Dr Kathryne Hitch is who he wants to see. Please advise.

## 2013-07-21 ENCOUNTER — Ambulatory Visit: Payer: BC Managed Care – PPO | Admitting: Family Medicine

## 2013-08-08 ENCOUNTER — Ambulatory Visit: Payer: BC Managed Care – PPO | Admitting: Family Medicine

## 2013-08-08 DIAGNOSIS — Z0289 Encounter for other administrative examinations: Secondary | ICD-10-CM

## 2013-08-21 ENCOUNTER — Ambulatory Visit: Payer: BC Managed Care – PPO | Admitting: Family Medicine

## 2013-08-21 DIAGNOSIS — Z0289 Encounter for other administrative examinations: Secondary | ICD-10-CM

## 2014-02-12 ENCOUNTER — Other Ambulatory Visit: Payer: Self-pay | Admitting: Family Medicine

## 2014-02-14 ENCOUNTER — Encounter: Payer: Self-pay | Admitting: Family Medicine

## 2014-02-14 ENCOUNTER — Ambulatory Visit (INDEPENDENT_AMBULATORY_CARE_PROVIDER_SITE_OTHER): Payer: BC Managed Care – PPO | Admitting: Family Medicine

## 2014-02-14 VITALS — BP 141/90 | HR 84 | Ht 69.0 in | Wt 266.0 lb

## 2014-02-14 DIAGNOSIS — I10 Essential (primary) hypertension: Secondary | ICD-10-CM

## 2014-02-14 MED ORDER — OLMESARTAN-AMLODIPINE-HCTZ 40-5-25 MG PO TABS: ORAL_TABLET | ORAL | Status: DC

## 2014-02-14 MED ORDER — METOPROLOL SUCCINATE ER 100 MG PO TB24: ORAL_TABLET | ORAL | Status: DC

## 2014-02-14 NOTE — Progress Notes (Signed)
   Subjective:    Patient ID: David Cole, male    DOB: 05-22-1970, 43 y.o.   MRN: [id number]  HPI Hypertension- Pt denies chest pain, SOB, dizziness, or heart palpitations.  Taking meds as directed w/o problems.  Denies medication side effects.  Was on juicing diet and had lost about 15 lbs and started to feel lightheaded. He thought maybe his blood pressure was starting to drop. He had planned on coming in but action, got off of decreasing diet for short period time and gained a couple of pounds back..  He would like to lose about 60 lbs  . He ran out of his medications about 2 days ago. Review of Systems     Objective:   Physical Exam  Constitutional: He is oriented to person, place, and time. He appears well-developed and well-nourished.  HENT:  Head: Normocephalic and atraumatic.  Cardiovascular: Normal rate, regular rhythm and normal heart sounds.   Pulmonary/Chest: Effort normal and breath sounds normal.  Neurological: He is alert and oriented to person, place, and time.  Skin: Skin is warm and dry.  Psychiatric: He has a normal mood and affect. His behavior is normal.          Assessment & Plan:  Hypertension-elevated today but out of his medicine for about 2 days. We will refill them today. If he starts losing weight and feels lightheaded again encouraged him to come into the we can check his pressure and adjust his medications. I would let for him to lose at least 60-70 pounds and I think he could come off of a lot of his blood pressure medicines.  Note, he was also having difficulty with his co-pay card. The price has not drop down to $20 like it should have after 6 months. Encouraged him to call 100 number on the co-pay card.

## 2014-08-16 ENCOUNTER — Ambulatory Visit: Payer: BC Managed Care – PPO | Admitting: Family Medicine

## 2014-08-27 ENCOUNTER — Other Ambulatory Visit: Payer: Self-pay | Admitting: Family Medicine

## 2014-08-28 ENCOUNTER — Telehealth: Payer: Self-pay | Admitting: *Deleted

## 2014-08-28 ENCOUNTER — Other Ambulatory Visit: Payer: Self-pay | Admitting: *Deleted

## 2014-08-28 MED ORDER — OLMESARTAN-AMLODIPINE-HCTZ 40-5-25 MG PO TABS: ORAL_TABLET | ORAL | Status: DC

## 2014-08-28 NOTE — Telephone Encounter (Signed)
Called pt and left message with his wife for him to call our office. Pt will need an appt and labs to get refills of his medications.Audelia Hives [place]

## 2014-08-28 NOTE — Telephone Encounter (Signed)
Pt called back appt made for [phone]  pm pt asked to arrive 10 mins early for appt he was informed that I would send his prescriptions in for a short term and that he must f/u. Pt voiced understanding and agreed...  Audelia Hives [place]

## 2014-09-04 ENCOUNTER — Other Ambulatory Visit: Payer: Self-pay | Admitting: Family Medicine

## 2014-09-04 ENCOUNTER — Encounter: Payer: Self-pay | Admitting: Family Medicine

## 2014-09-04 ENCOUNTER — Ambulatory Visit (INDEPENDENT_AMBULATORY_CARE_PROVIDER_SITE_OTHER): Payer: BLUE CROSS/BLUE SHIELD | Admitting: Family Medicine

## 2014-09-04 VITALS — BP 122/84 | HR 85 | Ht 69.0 in | Wt 271.0 lb

## 2014-09-04 DIAGNOSIS — E785 Hyperlipidemia, unspecified: Secondary | ICD-10-CM

## 2014-09-04 DIAGNOSIS — I10 Essential (primary) hypertension: Secondary | ICD-10-CM | POA: Diagnosis not present

## 2014-09-04 DIAGNOSIS — Z114 Encounter for screening for human immunodeficiency virus [HIV]: Secondary | ICD-10-CM

## 2014-09-04 DIAGNOSIS — R0683 Snoring: Secondary | ICD-10-CM | POA: Diagnosis not present

## 2014-09-04 DIAGNOSIS — S161XXA Strain of muscle, fascia and tendon at neck level, initial encounter: Secondary | ICD-10-CM | POA: Diagnosis not present

## 2014-09-04 DIAGNOSIS — G4719 Other hypersomnia: Secondary | ICD-10-CM

## 2014-09-04 MED ORDER — CYCLOBENZAPRINE HCL 10 MG PO TABS: 10.0000 mg | ORAL_TABLET | Freq: Every evening | ORAL | Status: DC | PRN

## 2014-09-04 NOTE — Progress Notes (Signed)
Subjective:    Patient ID: David Cole, male    DOB: [birth year], 44 y.o.   MRN: [id number]  HPI Hypertension- Pt denies chest pain, SOB, dizziness, or heart palpitations.  Taking meds as directed w/o problems.  Denies medication side effects.    Hyperlipidemia- not on a statin.  Due to recheck levels.   Lab Results  Component Value Date   CHOL 198 06/13/2013   HDL 32* 06/13/2013   LDLCALC 111* 06/13/2013   TRIG 273* 06/13/2013   CHOLHDL 6.2 06/13/2013   Wants to discuss sleep issues.  Goes to bed around 9PM and gets up around 5 PM. Wife says he is snoring more and he easily nod off during the daytime. Sister has sleep apnea. Waking up tried in the AM.  No Am headaches.    Also has a crick in his neck on the right side that started about a week ago, as he things he may have slept on it wrong.. No trauma or injury. Did try aleve. He says it does feel a little bit better today and he has a little bit more rotation than he did previously. It's worse if he tries to rotate his head. Better at rest. No headache. Pain is just lateral to the cervical spine more so on the right compared to the left.  Review of Systems  BP 122/84 mmHg  Pulse 85  Ht 5\' 9"  (1.753 m)  Wt 271 lb (122.925 kg)  BMI 40.00 kg/m2    No Known Allergies  Past Medical History  Diagnosis Date  . Hypertension     History reviewed. No pertinent past surgical history.  History   Social History  . Marital Status: Married    Spouse Name: [name]  . Number of Children: 1  . Years of Education: N/A   Occupational History  . Billing specialist [employer]   Social History Main Topics  . Smoking status: Never Smoker   . Smokeless tobacco: Not on file  . Alcohol Use: 0.0 oz/week    1-3 drink(s) per week     Comment: per week  . Drug Use: No  . Sexual Activity: Not on file   Other Topics Concern  . Not on file   Social History Narrative   1 caffeine drink per day. Some college     Family  History  Problem Relation Age of Onset  . Alcohol abuse      GF    Outpatient Encounter Prescriptions as of 09/04/2014  Medication Sig  . cyclobenzaprine (FLEXERIL) 10 MG tablet Take 1 tablet (10 mg total) by mouth at bedtime as needed for muscle spasms.  . metoprolol succinate (TOPROL-XL) 100 MG 24 hr tablet Take 1 tablet (100 mg total) by mouth daily. NO ADDITIONAL REFILLS WILL BE GIVEN IF PATIENT DOES NOT KEEP HIS APPOINTMENT ON 09/21/2014  . Olmesartan-Amlodipine-HCTZ (TRIBENZOR) 40-5-25 MG TABS TAKE 1 TABLET BY MOUTH DAILY.  NO ADDITIONAL REFILLS WILL BE GIVEN IF PATIENT DOES NOT KEEP HIS APPOINTMENT ON 09/21/2014  . [DISCONTINUED] Olopatadine HCl 0.2 % SOLN Apply 1 drop to eye as needed.   No facility-administered encounter medications on file as of 09/04/2014.          Objective:   Physical Exam  Constitutional: He is oriented to person, place, and time. He appears well-developed and well-nourished.  HENT:  Head: Normocephalic and atraumatic.  Cardiovascular: Normal rate, regular rhythm and normal heart sounds.   Pulmonary/Chest: Effort normal and breath sounds normal.  Musculoskeletal:  Neck with normal flexion and extension. Symmetric rotation right and left. Decreased side bending to the right compared to left. Nontender of the cervical spine or trapezius muscles.  Neurological: He is alert and oriented to person, place, and time.  Skin: Skin is warm and dry.  Psychiatric: He has a normal mood and affect. His behavior is normal.          Assessment & Plan:  HTN - well controlled continue current option. Follow-up in 6 months.  Hyperlipidemia-due to recheck levels. Lab slip provided.  Snoring with possible apnea-stop bang questionnaire was positive for screening. He answered yes to all questions except for age over 14. A have a strong suspicion he does have sleep apnea. Will order split sleep study since he does have [facility] and they tend to not cover home  studies. Sister has a history of sleep apnea.  Cervical strain-given handout on stretches to do on his own at home. Recommend an antibiotic for it for the next 5-7 days twice a day as well as stretches with gentle range of motion. Call if not significantly better in one to 2 weeks.

## 2014-09-05 LAB — LIPID PANEL
CHOL/HDL RATIO: 6 ratio
CHOLESTEROL: 210 mg/dL — AB (ref 0–200)
HDL: 35 mg/dL — AB (ref >=40)
LDL Cholesterol: 124 mg/dL — ABNORMAL HIGH (ref 0–99)
TRIGLYCERIDES: 253 mg/dL — AB (ref <150)
VLDL: 51 mg/dL — ABNORMAL HIGH (ref 0–40)

## 2014-09-05 LAB — COMPLETE METABOLIC PANEL WITH GFR
ALK PHOS: 80 U/L (ref 39–117)
ALT: 68 U/L — ABNORMAL HIGH (ref 0–53)
AST: 52 U/L — ABNORMAL HIGH (ref 0–37)
Albumin: 4.6 g/dL (ref 3.5–5.2)
BILIRUBIN TOTAL: 0.7 mg/dL (ref 0.2–1.2)
BUN: 13 mg/dL (ref 6–23)
CO2: 24 meq/L (ref 19–32)
Calcium: 9.5 mg/dL (ref 8.4–10.5)
Chloride: 101 mEq/L (ref 96–112)
Creat: 0.91 mg/dL (ref 0.50–1.35)
GFR, Est African American: >89 mL/min
GLUCOSE: 83 mg/dL (ref 70–99)
Potassium: 4 mEq/L (ref 3.5–5.3)
Sodium: 137 mEq/L (ref 135–145)
Total Protein: 7.5 g/dL (ref 6.0–8.3)

## 2014-09-05 LAB — HIV ANTIBODY (ROUTINE TESTING W REFLEX): HIV 1&2 Ab, 4th Generation: NONREACTIVE

## 2014-09-06 LAB — HEPATITIS PANEL, ACUTE
HCV AB: NEGATIVE
HEP B C IGM: NONREACTIVE
HEP B S AG: NEGATIVE
Hep A IgM: NONREACTIVE

## 2014-09-21 ENCOUNTER — Ambulatory Visit: Payer: Self-pay | Admitting: Family Medicine

## 2014-11-02 ENCOUNTER — Ambulatory Visit (HOSPITAL_BASED_OUTPATIENT_CLINIC_OR_DEPARTMENT_OTHER): Payer: BLUE CROSS/BLUE SHIELD | Attending: Family Medicine | Admitting: *Deleted

## 2014-11-02 DIAGNOSIS — G4733 Obstructive sleep apnea (adult) (pediatric): Secondary | ICD-10-CM | POA: Diagnosis not present

## 2014-11-02 DIAGNOSIS — R0683 Snoring: Secondary | ICD-10-CM | POA: Diagnosis not present

## 2014-11-02 DIAGNOSIS — G4719 Other hypersomnia: Secondary | ICD-10-CM | POA: Diagnosis present

## 2014-11-05 DIAGNOSIS — R0683 Snoring: Secondary | ICD-10-CM | POA: Diagnosis not present

## 2014-11-05 DIAGNOSIS — G4719 Other hypersomnia: Secondary | ICD-10-CM | POA: Diagnosis not present

## 2014-11-05 NOTE — Progress Notes (Signed)
Patient Name: David Cole, David Cole Date: 11/02/2014 Gender: Male D.O.B: [birth year] Age (years): 44 Referring Provider: Beatrice Lecher Height (inches): 69 Interpreting Physician: Baird Lyons MD, ABSM Weight (lbs): 270 RPSGT: Gerhard Perches BMI: 40 MRN: [id number] Neck Size: 19.00 CLINICAL INFORMATION Sleep Study Type: Split Night CPAP  Indication for sleep study: Excessive Daytime Sleepiness, Snoring  Epworth Sleepiness Score: 15  SLEEP STUDY TECHNIQUE As per the AASM Manual for the Scoring of Sleep and Associated Events v2.3 (April 2016) with a hypopnea requiring 4% desaturations.  The channels recorded and monitored were frontal, central and occipital EEG, electrooculogram (EOG), submentalis EMG (chin), nasal and oral airflow, thoracic and abdominal wall motion, anterior tibialis EMG, snore microphone, electrocardiogram, and pulse oximetry. Continuous positive airway pressure (CPAP) was initiated when the patient met split night criteria and was titrated according to treat sleep-disordered breathing.  MEDICATIONS Medications taken by the patient :charted for review Medications administered by patient during sleep study : No sleep medicine administered.  RESPIRATORY PARAMETERS Diagnostic  Total AHI (/hr): 75.1 RDI (/hr): 75.1 OA Index (/hr): 28.7 CA Index (/hr): 0.0 REM AHI (/hr): N/A NREM AHI (/hr): 75.1 Supine AHI (/hr): 79.2 Non-supine AHI (/hr): 79.37 Min O2 Sat (%): 70.00 Mean O2 (%): 88.44 Time below 88% (min): 55.3   Titration  Optimal Pressure (cm): 11 AHI at Optimal Pressure (/hr): 0.0 Min O2 at Optimal Pressure (%): 92.0 Supine % at Optimal (%): 100 Sleep % at Optimal (%): 100    SLEEP ARCHITECTURE The recording time for the entire night was 408.9 minutes.  During a baseline period of 185.3 minutes, the patient slept for 121.4 minutes in REM and nonREM, yielding a sleep efficiency of 65.5%. Sleep onset after lights out was 17.4 minutes with a REM  latency of N/A minutes. The patient spent 12.76% of the night in stage N1 sleep, 87.24% in stage N2 sleep, 0.00% in stage N3 and 0.00% in REM.  During the titration period of 221.8 minutes, the patient slept for 162.0 minutes in REM and nonREM, yielding a sleep efficiency of 73.0%. Sleep onset after CPAP initiation was 36.8 minutes with a REM latency of 46.0 minutes. The patient spent 2.78% of the night in stage N1 sleep, 36.73% in stage N2 sleep, 22.53% in stage N3 and 37.96% in REM.  CARDIAC DATA The 2 lead EKG demonstrated sinus rhythm. The mean heart rate was 76.15 beats per minute. Other EKG findings include: None.  LEG MOVEMENT DATA The total Periodic Limb Movements of Sleep (PLMS) were 132. The PLMS index was 27.89 .  IMPRESSIONS Severe obstructive sleep apnea occurred during the diagnostic portion of the study (AHI = 75.1/hour). An optimal PAP pressure was selected for this patient ( 11 cm of water) No significant central sleep apnea occurred during the diagnostic portion of the study (CAI = 0.0/hour). Severe oxygen desaturation was noted during the diagnostic portion of the study (Min O2 = 70.00%). The patient snored with Loud snoring volume during the diagnostic portion of the study. No cardiac abnormalities were noted during this study. Clinically significant periodic limb movements did not occur during sleep.  DIAGNOSIS Obstructive Sleep Apnea (327.23 [G47.33 ICD-10])  RECOMMENDATIONS Trial of CPAP therapy on 11 cm H2O with a Small size Fisher&Paykel Full Face Mask Simplus mask and heated humidification. Avoid alcohol, sedatives and other CNS depressants that may worsen sleep apnea and disrupt normal sleep architecture. Sleep hygiene should be reviewed to assess factors that may improve sleep quality. Weight management and regular exercise should be  initiated or continued.    Deneise Lever Diplomate, American Board of Sleep Medicine  ELECTRONICALLY SIGNED ON:  11/05/2014,  1:45 PM Export PH: [phone]   FX: [phone] [place]

## 2014-11-07 ENCOUNTER — Telehealth: Payer: Self-pay | Admitting: Family Medicine

## 2014-11-07 NOTE — Telephone Encounter (Signed)
Please call pt: sleep study results are back in. Have him make appt to review.

## 2014-11-07 NOTE — Telephone Encounter (Signed)
lvm asking that pt call back and schedule an appt to discuss results.David Cole [place]

## 2014-11-15 ENCOUNTER — Encounter: Payer: Self-pay | Admitting: Family Medicine

## 2014-11-15 ENCOUNTER — Ambulatory Visit (INDEPENDENT_AMBULATORY_CARE_PROVIDER_SITE_OTHER): Payer: BLUE CROSS/BLUE SHIELD | Admitting: Family Medicine

## 2014-11-15 VITALS — BP 138/71 | HR 75 | Ht 69.0 in | Wt 271.0 lb

## 2014-11-15 DIAGNOSIS — G4733 Obstructive sleep apnea (adult) (pediatric): Secondary | ICD-10-CM | POA: Diagnosis not present

## 2014-11-15 DIAGNOSIS — Z9989 Dependence on other enabling machines and devices: Principal | ICD-10-CM

## 2014-11-15 MED ORDER — AMBULATORY NON FORMULARY MEDICATION: Status: DC

## 2014-11-15 NOTE — Progress Notes (Signed)
   Subjective:    Patient ID: David Cole, male    DOB: 1970/12/06, 44 y.o.   MRN: [id number]  HPI Patient comes in today to follow-up on sleep study that was recently performed. He had a split-night study performed at [facility]. We had originally sent him because of an elevated stop paying score as well as snoring.   Review of Systems     Objective:   Physical Exam  Constitutional: He is oriented to person, place, and time. He appears well-developed.  HENT:  Head: Normocephalic and atraumatic.  Neurological: He is alert and oriented to person, place, and time.  Skin: Skin is warm.  Psychiatric: His behavior is normal.          Assessment & Plan:  Sleep apnea -   Reviewed his results with him. He does have severe sleep apnea. His apnea-hypopnea index was 75.1/h. His optimal pressure is set at 11 cm of water pressure. Were no significant central apneas which is reassuring but his oxygen did drop to 70% during part of the study. He did not have any periodic limb movement which is also reassuring. We reviewed his diagnosis and we discussed treatment options. We will move forward with ordering supplies through a DME supplier. If he is having problems with tubing or masks etc. [employer] to contact the company directly so that he can try different options if needed. He did complain of some air leak on the right Side of the nasal bridge with the mask during the sleep study. He will see me back in one  Month to review how he is doing with the CPAP and to see if any adjustments need to be made. Hopefully he will do well and we can correct his apneas. We also discussed the importance of weight loss. He says he will check into joining a gym. I also encouraged him to check with his health insurance to see if they may cover it now but he does have a diagnosis of sleep apnea. We also provided a letter for him stating his diagnosis as unfortunately he actually fell asleep during  lunch at work and was written up for this.   time spent 25 minutes, greater 50% time spent counseling about new diagnosis of sleep apnea and treatment and follow-up that will be needed.

## 2014-11-16 ENCOUNTER — Telehealth: Payer: Self-pay | Admitting: Family Medicine

## 2014-11-16 NOTE — Telephone Encounter (Signed)
Received order for Cpap machine i faxed all information with the script and ov notes from sleep center plus ov note and insurance to [employer] at [phone] and [phone] they will call and schedule with patient. - CF

## 2014-11-19 ENCOUNTER — Telehealth: Payer: Self-pay | Admitting: Family Medicine

## 2014-11-19 NOTE — Telephone Encounter (Signed)
Pt called and stated he hasnt heard anything abou the cpap machine and he is concerned because he wants to get the

## 2014-12-07 ENCOUNTER — Other Ambulatory Visit: Payer: Self-pay | Admitting: Family Medicine

## 2014-12-13 ENCOUNTER — Encounter: Payer: Self-pay | Admitting: Family Medicine

## 2014-12-13 ENCOUNTER — Ambulatory Visit (INDEPENDENT_AMBULATORY_CARE_PROVIDER_SITE_OTHER): Payer: BLUE CROSS/BLUE SHIELD | Admitting: Family Medicine

## 2014-12-13 DIAGNOSIS — G5712 Meralgia paresthetica, left lower limb: Secondary | ICD-10-CM | POA: Diagnosis not present

## 2014-12-13 DIAGNOSIS — M5431 Sciatica, right side: Secondary | ICD-10-CM | POA: Diagnosis not present

## 2014-12-13 DIAGNOSIS — G4733 Obstructive sleep apnea (adult) (pediatric): Secondary | ICD-10-CM | POA: Diagnosis not present

## 2014-12-13 DIAGNOSIS — K641 Second degree hemorrhoids: Secondary | ICD-10-CM | POA: Diagnosis not present

## 2014-12-13 DIAGNOSIS — Z9989 Dependence on other enabling machines and devices: Principal | ICD-10-CM

## 2014-12-13 MED ORDER — METOPROLOL SUCCINATE ER 100 MG PO TB24: ORAL_TABLET | ORAL | Status: DC

## 2014-12-13 MED ORDER — OLMESARTAN-AMLODIPINE-HCTZ 40-5-25 MG PO TABS: ORAL_TABLET | ORAL | Status: DC

## 2014-12-13 NOTE — Progress Notes (Signed)
Subjective:    Patient ID: David Cole, male    DOB: 17-Jun-1970, 44 y.o.   MRN: [id number]  HPI Sleep Apnea - Will call for report.  Says he has gotten a lower firmer pillow.  Download from the last 10 days shows AHI > 4. Says feels like can't breth as it is ramping.  Says feels bette when the pressure gets to 11.  He is feeling better and more focused.   Numbness on the left thigh -  It seems to come and go. Worse if he wears a wallet in that left pocket orbits season that pocket. Is not really painful but just feels like the outside goes numb.  Pain on the outside of the right outer lower leg for 2-3 months. Says when he crosses his leg to to put socks on it happens and feels like a sharp pain on the outside of the leg and affects the last 2 toes.   He denies any low back pai or recent injury. He doesn't take any medication for it. Discomfort is brief and just lasts seconds to maybe a minute or 2.   He also complains of bleeding hemorrhoids. He said the last couple years at least one series had an episode where his hemorrhoids have bled. This time it was a lot and was literally dripping into the toilet. He started using some over-the-counter suppositories and his symptoms resolved.  Review of Systems  There were no vitals taken for this visit.    No Known Allergies  Past Medical History  Diagnosis Date  . Hypertension     No past surgical history on file.  Social History   Social History  . Marital Status: Married    Spouse Name: [name]  . Number of Children: 1  . Years of Education: N/A   Occupational History  . Billing specialist [employer]   Social History Main Topics  . Smoking status: Never Smoker   . Smokeless tobacco: Not on file  . Alcohol Use: 0.0 oz/week    1-3 drink(s) per week     Comment: per week  . Drug Use: No  . Sexual Activity: Not on file   Other Topics Concern  . Not on file   Social History Narrative   1 caffeine drink per day.  Some college     Family History  Problem Relation Age of Onset  . Alcohol abuse      GF    Outpatient Encounter Prescriptions as of 12/13/2014  Medication Sig  . AMBULATORY NON FORMULARY MEDICATION Medication Name:  CPAP therapy on 11 cm H2O with a Small size Fisher&Paykel Full Face Mask Simplus mask and heated humidification. Tubing and supplies as well. [employer].  Dx OSA.  Please attach copy of sleep study. Done at [facility]  . metoprolol succinate (TOPROL-XL) 100 MG 24 hr tablet Take one tablet by mouth daily. F/u due around 1/17  . Olmesartan-Amlodipine-HCTZ (TRIBENZOR) 40-5-25 MG TABS Take on tablet by mouth daily. F/u due around 1/17  . [DISCONTINUED] metoprolol succinate (TOPROL-XL) 100 MG 24 hr tablet Take one tablet by mouth daily. F/u due around 1/17  . [DISCONTINUED] Olmesartan-Amlodipine-HCTZ (TRIBENZOR) 40-5-25 MG TABS Take on tablet by mouth daily. F/u due around 1/17   No facility-administered encounter medications on file as of 12/13/2014.          Objective:   Physical Exam  Constitutional: He appears well-developed and well-nourished.  HENT:  Head: Normocephalic and atraumatic.  Genitourinary: Rectal  exam shows external hemorrhoid. Rectal exam shows no internal hemorrhoid, no fissure, no mass, no tenderness and anal tone normal. Guaiac negative stool. Prostate is enlarged.  Prostate is mildly enlarge and symmetric with no nodules.    Musculoskeletal:  Normal lumbar flexion and extension with rotation right and left. Nontender over the lumbar spine or SI joints. Negative straight leg raise bilaterally. Hip, knee, ankle joint is 5 out of 5 bilaterally. Patellar reflexes are 2+. Unable to re-create his symptoms. Normal hip internal and external rotation without any discomfort or recurrence of symptoms.  Skin: Skin is warm and dry.  Psychiatric: He has a normal mood and affect. His behavior is normal.          Assessment & Plan:  Sleep apnea -  Based on the  information on the download on his phone he only had 3 out of 10 nights where his apnea-hypopnea index was  4 or less. [place] in order to the company to increase his pressure to 12 cm of water pressure. I will see him back in 3 months and we will see how is doing at that time.  Right sciatica -  Based on the distribution of his discomfort it's right along the  Sciatic nerve. Even though he's not expensing any back pain currently get him a handout for low back strain stretches to see if this improves his symptoms. If not then please let me know and we can consider further workup with nerve conduction study. Next   lateral femoral cutaneous neuropathy- discuss that pressure along the groin crease in the area can sometimes cause compression of the femoral cutaneous nerve and make that area to like it's going on. Avoid wearing wallet or keys or other items in that pocket and avoid wearing tight fitting clothing that presses on the groin crease. It should resolve over the next few weeks. Next  Hemorrhoids-not actively inflamed or bleeding today. Gave reassurance. Rectals exam was normal except for mildly enlarged prostate but he is not having any prostate symptoms.

## 2014-12-20 ENCOUNTER — Ambulatory Visit: Payer: BLUE CROSS/BLUE SHIELD | Admitting: Family Medicine

## 2015-01-14 ENCOUNTER — Telehealth: Payer: Self-pay

## 2015-01-14 NOTE — Telephone Encounter (Signed)
David Cole called and states his CPAP company has not received the order for increase in the pressure for his CPAP. Please advise.

## 2015-01-15 MED ORDER — AMBULATORY NON FORMULARY MEDICATION: Status: DC

## 2015-01-15 NOTE — Telephone Encounter (Signed)
Printed order. Will fax today to [facility].

## 2015-01-15 NOTE — Telephone Encounter (Signed)
Okay to put in a prescription to place order for them to increase his CPAP pressure to 12 cm of water pressure. Thank you.

## 2015-02-01 ENCOUNTER — Ambulatory Visit (INDEPENDENT_AMBULATORY_CARE_PROVIDER_SITE_OTHER): Payer: BLUE CROSS/BLUE SHIELD | Admitting: Family Medicine

## 2015-02-01 ENCOUNTER — Encounter: Payer: Self-pay | Admitting: Family Medicine

## 2015-02-01 VITALS — BP 130/84 | HR 65 | Temp 98.3°F | Resp 18 | Wt 280.9 lb

## 2015-02-01 DIAGNOSIS — S0181XA Laceration without foreign body of other part of head, initial encounter: Secondary | ICD-10-CM | POA: Diagnosis not present

## 2015-02-01 DIAGNOSIS — Z4802 Encounter for removal of sutures: Secondary | ICD-10-CM

## 2015-02-01 NOTE — Progress Notes (Signed)
   Subjective:    Patient ID: David Cole, male    DOB: [birth year], 44 y.o.   MRN: [id number]  HPI Patient is here today to have sutures removed from a laceration just above his left eye. He had a vasovagal syncopal episode on November 24 and was seen by [facility].He is doing well.  He has 5 sutures that need to be removed. He also has a laceration over the nasal bridge but that did not require any suturing. They just applied a topical Band-Aid type glue. He is doing well but still has some swelling around the left eye. He did see the eye doctor and was diagnosed with a corneal abrasion. He was given some antibiotic eyedrops and says it's feeling much better. No follow-up was scheduled for that.   Review of Systems     Objective:   Physical Exam  Constitutional: He is oriented to person, place, and time. He appears well-developed and well-nourished.  HENT:  Head: Normocephalic and atraumatic.  Eyes: Conjunctivae and EOM are normal.  Cardiovascular: Normal rate.   Pulmonary/Chest: Effort normal.  Neurological: He is alert and oriented to person, place, and time.  Skin: Skin is dry. No pallor.  Laceration over left forehead is healing well. The edges are slightly inverted. There is a laceration over his nasal bridge that is also healing well as well as some bruising and swelling around the left eye.  Psychiatric: He has a normal mood and affect. His behavior is normal.  Vitals reviewed.         Assessment & Plan:  Laceration to forehead-patient tolerated suture removal well. Follow-up when care discussed. Okay to start using a scar cream or even just using plain Vaseline for wound healing. Reassured him that it may take another 3-4 weeks for the swelling around the left eye to go down.

## 2015-03-07 ENCOUNTER — Ambulatory Visit: Payer: BLUE CROSS/BLUE SHIELD | Admitting: Family Medicine

## 2015-03-09 ENCOUNTER — Other Ambulatory Visit: Payer: Self-pay | Admitting: Family Medicine

## 2015-03-15 ENCOUNTER — Encounter: Payer: Self-pay | Admitting: Family Medicine

## 2015-03-15 ENCOUNTER — Ambulatory Visit (INDEPENDENT_AMBULATORY_CARE_PROVIDER_SITE_OTHER): Payer: BLUE CROSS/BLUE SHIELD | Admitting: Family Medicine

## 2015-03-15 VITALS — BP 130/66 | HR 68 | Temp 97.9°F | Wt 280.0 lb

## 2015-03-15 DIAGNOSIS — J069 Acute upper respiratory infection, unspecified: Secondary | ICD-10-CM

## 2015-03-15 DIAGNOSIS — R0602 Shortness of breath: Secondary | ICD-10-CM | POA: Diagnosis not present

## 2015-03-15 DIAGNOSIS — I10 Essential (primary) hypertension: Secondary | ICD-10-CM | POA: Diagnosis not present

## 2015-03-15 LAB — BASIC METABOLIC PANEL WITH GFR
BUN: 17 mg/dL (ref 7–25)
CALCIUM: 9.9 mg/dL (ref 8.6–10.3)
CO2: 29 mmol/L (ref 20–31)
CREATININE: 1.11 mg/dL (ref 0.60–1.35)
Chloride: 101 mmol/L (ref 98–110)
GFR, Est African American: >89 mL/min (ref >=60)
GFR, Est Non African American: 80 mL/min (ref >=60)
GLUCOSE: 106 mg/dL — AB (ref 65–99)
Potassium: 3.8 mmol/L (ref 3.5–5.3)
Sodium: 141 mmol/L (ref 135–146)

## 2015-03-15 MED ORDER — OLMESARTAN-AMLODIPINE-HCTZ 40-5-25 MG PO TABS: ORAL_TABLET | ORAL | Status: DC

## 2015-03-15 MED ORDER — METOPROLOL SUCCINATE ER 100 MG PO TB24
ORAL_TABLET | ORAL | Status: DC
Start: 2015-03-15 — End: 2015-08-09

## 2015-03-15 NOTE — Progress Notes (Signed)
   Subjective:    Patient ID: David Cole, male    DOB: October 26, 1970, 45 y.o.   MRN: [id number]  HPI Hypertension- Pt denies chest pain, SOB, dizziness, or heart palpitations.  Taking meds as directed w/o problems.  Denies medication side effects.  ON Tribenzor and metoprolol.    Lab Results  Component Value Date   CHOL 210* 09/04/2014   HDL 35* 09/04/2014   LDLCALC 124* 09/04/2014   TRIG 253* 09/04/2014   CHOLHDL 6.0 09/04/2014   Cough x 2 days.  + post nasal drip.  Throat is sore.  Using cough drops.  No fever, chills or sweats. No strep exposure.   Feels some SOB for months before the acute illness. Marland Kitchen  He is using his CPAP.  Never smoked. Had about 10 years of dust exposure through his work. Notices mostly at work when talking on the phone.  No wheezing.    Review of Systems     Objective:   Physical Exam  Constitutional: He is oriented to person, place, and time. He appears well-developed and well-nourished.  HENT:  Head: Normocephalic and atraumatic.  Right Ear: External ear normal.  Left Ear: External ear normal.  Nose: Nose normal.  Mouth/Throat: Oropharynx is clear and moist.  TMs and canals are clear.   Eyes: Conjunctivae and EOM are normal. Pupils are equal, round, and reactive to light.  Neck: Neck supple. No thyromegaly present.  Cardiovascular: Normal rate and normal heart sounds.   Pulmonary/Chest: Effort normal and breath sounds normal.  Lymphadenopathy:    He has no cervical adenopathy.  Neurological: He is alert and oriented to person, place, and time.  Skin: Skin is warm and dry.  Psychiatric: He has a normal mood and affect.          Assessment & Plan:  HTN - well controlled. F/U in 6 months. Meds sent to new mail-order.   Acute URI - likely viral. Call if getting worse or develops fever.  REcommend symptomatic care  SOB - can schedule spirometry in 2-3 weeks after acute URI has resolved.

## 2015-03-17 NOTE — Progress Notes (Signed)
Quick Note:  All labs are normal. ______ 

## 2015-04-11 ENCOUNTER — Ambulatory Visit (INDEPENDENT_AMBULATORY_CARE_PROVIDER_SITE_OTHER): Payer: BLUE CROSS/BLUE SHIELD | Admitting: Family Medicine

## 2015-04-11 ENCOUNTER — Encounter: Payer: Self-pay | Admitting: Family Medicine

## 2015-04-11 VITALS — BP 139/72 | HR 83 | Temp 98.7°F | Ht 69.0 in | Wt 280.0 lb

## 2015-04-11 DIAGNOSIS — R0602 Shortness of breath: Secondary | ICD-10-CM

## 2015-04-11 DIAGNOSIS — L723 Sebaceous cyst: Secondary | ICD-10-CM | POA: Diagnosis not present

## 2015-04-11 DIAGNOSIS — R2232 Localized swelling, mass and lump, left upper limb: Secondary | ICD-10-CM | POA: Diagnosis not present

## 2015-04-11 MED ORDER — OMEPRAZOLE 40 MG PO CPDR: 40.0000 mg | DELAYED_RELEASE_CAPSULE | Freq: Every day | ORAL | Status: DC

## 2015-04-11 MED ORDER — ALBUTEROL SULFATE (2.5 MG/3ML) 0.083% IN NEBU
2.5000 mg | INHALATION_SOLUTION | Freq: Once | RESPIRATORY_TRACT | Status: DC
Start: 2015-04-11 — End: 2015-04-11
  Administered 2015-04-11: 2.5 mg via RESPIRATORY_TRACT

## 2015-04-11 NOTE — Progress Notes (Signed)
   Subjective:    Patient ID: David Cole, male    DOB: January 23, 1971, 45 y.o.   MRN: [id number]  HPI 45 year old male comes in today with a complaint of shortness of breath. I saw him a couple of weeks ago for an upper respiratory infection but he reported he had been feeling short of breath for several months before the acute illness. He does have sleep apnea and uses his CPAP regularly. He has never smoked. He did have about 10 years of dust exposure through his work. He notices the surface of breath mostly when he started talking on the phone while at work. He denies any wheezing or cough or significant congestion. Encouraged him to come back after his URI had resolved for full spirometry for further evaluation.  He also has a lump on his left forearm that he would like me to look at today. He says at times it gets really swollen and large in the skin over top of it was actually turn red and then at other times it's palpable but smaller. He says it has been there for a couple of years.  Has a question about his abdomen. When he tries to do a crunch he notices that it bulges out. It's not painful or bothersome.  He also has a lump behind the right ear crease. He said it got really large about a week or so ago but seems to be getting smaller. He says it was tender initially but now it is not bothersome he would just like me to look at it today.  Review of Systems     Objective:   Physical Exam  Constitutional: He is oriented to person, place, and time. He appears well-developed and well-nourished.  HENT:  Head: Normocephalic and atraumatic.  Cardiovascular: Normal rate, regular rhythm and normal heart sounds.   Pulmonary/Chest: Effort normal and breath sounds normal.  Neurological: He is alert and oriented to person, place, and time.  Skin: Skin is warm and dry.  Psychiatric: He has a normal mood and affect. His behavior is normal.    Does have a small less than 1/2 cm sebaceous cyst behind  the right ear.  Lump on left forearm has almost a half U-shaped. It feels firm similar to a lipoma but because of the shape it less likely to be a lipoma. Consider cyst though is too deep to be a epidermal cyst. And does not appear to be consistent with a sebaceous cyst.     Assessment & Plan:  Shortness of breath-spirometry today is normal. FVC is 89%, FEV1 of 86% with a ratio of 79%. Pulse ox is 99%. Consider other causes such as GERD, deconditioning, vs obesity  Infection is unlikely. Consider chest x-ray. also recommened trial of a PPI for the next 2-3 weeks to see fit impoves his symptom in addition to sitting a regular exercis program and working on weight loss which he agrees to. Diastasis rectus - gave reassurance that this is a benign condition.    Lump in forearm. Feel like a cyst or lipoma but he says it really swells at time. If gets larger recommend he come in to see one of my sports medicine partners and have an ultrasound performed.  Sebaceous cyst behind right ear-gave reassurance. If it gets swollen or infected been waking refer for excision to dermatology since it that the crease behind the ear.

## 2015-04-11 NOTE — Patient Instructions (Signed)
You do not have asthma or COPD but because her numbers were little on the lower end of normal I would like to repeat your breathing test in about 3 years. The abdominal wall condition is called diastases rectus.

## 2015-08-09 ENCOUNTER — Other Ambulatory Visit: Payer: Self-pay | Admitting: Family Medicine

## 2015-09-03 ENCOUNTER — Other Ambulatory Visit: Payer: Self-pay | Admitting: Family Medicine

## 2015-09-19 ENCOUNTER — Telehealth: Payer: Self-pay | Admitting: Family Medicine

## 2015-09-19 NOTE — Telephone Encounter (Signed)
Called and LV for pt letting him know he is due for a bp fu. Thanks

## 2015-11-14 ENCOUNTER — Ambulatory Visit (INDEPENDENT_AMBULATORY_CARE_PROVIDER_SITE_OTHER): Payer: BLUE CROSS/BLUE SHIELD | Admitting: Family Medicine

## 2015-11-14 ENCOUNTER — Encounter: Payer: Self-pay | Admitting: Family Medicine

## 2015-11-14 VITALS — BP 121/82 | HR 80 | Temp 98.3°F | Wt 285.0 lb

## 2015-11-14 DIAGNOSIS — J019 Acute sinusitis, unspecified: Secondary | ICD-10-CM

## 2015-11-14 MED ORDER — METOPROLOL SUCCINATE ER 100 MG PO TB24: 100.0000 mg | ORAL_TABLET | Freq: Every day | ORAL | 1 refills | Status: DC

## 2015-11-14 MED ORDER — AZITHROMYCIN 250 MG PO TABS: ORAL_TABLET | ORAL | 0 refills | Status: AC

## 2015-11-14 MED ORDER — HYDROCODONE-HOMATROPINE 5-1.5 MG/5ML PO SYRP: 5.0000 mL | ORAL_SOLUTION | Freq: Every evening | ORAL | 0 refills | Status: DC | PRN

## 2015-11-14 NOTE — Patient Instructions (Addendum)
Sinus Rinse WHAT IS A SINUS RINSE? A sinus rinse is a simple home treatment that is used to rinse your sinuses with a sterile mixture of salt and water (saline solution). Sinuses are air-filled spaces in your skull behind the bones of your face and forehead that open into your nasal cavity. You will use the following:  Saline solution.  Neti pot or spray bottle. This releases the saline solution into your nose and through your sinuses. Neti pots and spray bottles can be purchased at [employer], a health food store, or online. WHEN WOULD I DO A SINUS RINSE? A sinus rinse can help to clear mucus, dirt, dust, or pollen from the nasal cavity. You may do a sinus rinse when you have a cold, a virus, nasal allergy symptoms, a sinus infection, or stuffiness in the nose or sinuses. If you are considering a sinus rinse:  Ask your child's health care provider before performing a sinus rinse on your child.  Do not do a sinus rinse if you have had ear or nasal surgery, ear infection, or blocked ears. HOW DO I DO A SINUS RINSE?  Wash your hands.  Disinfect your device according to the directions provided and then dry it.  Use the solution that comes with your device or one that is sold separately in stores. Follow the mixing directions on the package.  Fill your device with the amount of saline solution as directed by the device instructions.  Stand over a sink and tilt your head sideways over the sink.  Place the spout of the device in your upper nostril (the one closer to the ceiling).  Gently pour or squeeze the saline solution into the nasal cavity. The liquid should drain to the lower nostril if you are not overly congested.  Gently blow your nose. Blowing too hard may cause ear pain.  Repeat in the other nostril.  Clean and rinse your device with clean water and then air-dry it. ARE THERE RISKS OF A SINUS RINSE?  Sinus rinse is generally very safe and effective. However, there  are a few risks, which include:   A burning sensation in the sinuses. This may happen if you do not make the saline solution as directed. Make sure to follow all directions when making the saline solution.  Infection from contaminated water. This is rare, but possible.  Nasal irritation.   This information is not intended to replace advice given to you by your health care provider. Make sure you discuss any questions you have with your health care provider.   Document Released: 09/13/2013 Document Reviewed: 09/13/2013 Elsevier Interactive Patient Education 2016 [employer].  Sinusitis, Adult Sinusitis is redness, soreness, and inflammation of the paranasal sinuses. Paranasal sinuses are air pockets within the bones of your face. They are located beneath your eyes, in the middle of your forehead, and above your eyes. In healthy paranasal sinuses, mucus is able to drain out, and air is able to circulate through them by way of your nose. However, when your paranasal sinuses are inflamed, mucus and air can become trapped. This can allow bacteria and other germs to grow and cause infection. Sinusitis can develop quickly and last only a short time (acute) or continue over a long period (chronic). Sinusitis that lasts for more than 12 weeks is considered chronic. CAUSES Causes of sinusitis include:  Allergies.  Structural abnormalities, such as displacement of the cartilage that separates your nostrils (deviated septum), which can decrease the air flow  through your nose and sinuses and affect sinus drainage.  Functional abnormalities, such as when the small hairs (cilia) that line your sinuses and help remove mucus do not work properly or are not present. SIGNS AND SYMPTOMS Symptoms of acute and chronic sinusitis are the same. The primary symptoms are pain and pressure around the affected sinuses. Other symptoms include:  Upper toothache.  Earache.  Headache.  Bad breath.  Decreased  sense of smell and taste.  A cough, which worsens when you are lying flat.  Fatigue.  Fever.  Thick drainage from your nose, which often is green and may contain pus (purulent).  Swelling and warmth over the affected sinuses. DIAGNOSIS Your health care provider will perform a physical exam. During your exam, your health care provider may perform any of the following to help determine if you have acute sinusitis or chronic sinusitis:  Look in your nose for signs of abnormal growths in your nostrils (nasal polyps).  Tap over the affected sinus to check for signs of infection.  View the inside of your sinuses using an imaging device that has a light attached (endoscope). If your health care provider suspects that you have chronic sinusitis, one or more of the following tests may be recommended:  Allergy tests.  Nasal culture. A sample of mucus is taken from your nose, sent to a lab, and screened for bacteria.  Nasal cytology. A sample of mucus is taken from your nose and examined by your health care provider to determine if your sinusitis is related to an allergy. TREATMENT Most cases of acute sinusitis are related to a viral infection and will resolve on their own within 10 days. Sometimes, medicines are prescribed to help relieve symptoms of both acute and chronic sinusitis. These may include pain medicines, decongestants, nasal steroid sprays, or saline sprays. However, for sinusitis related to a bacterial infection, your health care provider will prescribe antibiotic medicines. These are medicines that will help kill the bacteria causing the infection. Rarely, sinusitis is caused by a fungal infection. In these cases, your health care provider will prescribe antifungal medicine. For some cases of chronic sinusitis, surgery is needed. Generally, these are cases in which sinusitis recurs more than 3 times per year, despite other treatments. HOME CARE INSTRUCTIONS  Drink plenty of  water. Water helps thin the mucus so your sinuses can drain more easily.  Use a humidifier.  Inhale steam 3-4 times a day (for example, sit in the bathroom with the shower running).  Apply a warm, moist washcloth to your face 3-4 times a day, or as directed by your health care provider.  Use saline nasal sprays to help moisten and clean your sinuses.  Take medicines only as directed by your health care provider.  If you were prescribed either an antibiotic or antifungal medicine, finish it all even if you start to feel better. SEEK IMMEDIATE MEDICAL CARE IF:  You have increasing pain or severe headaches.  You have nausea, vomiting, or drowsiness.  You have swelling around your face.  You have vision problems.  You have a stiff neck.  You have difficulty breathing.   This information is not intended to replace advice given to you by your health care provider. Make sure you discuss any questions you have with your health care provider.   Document Released: 02/16/2005 Document Revised: 03/09/2014 Document Reviewed: 03/03/2011 Elsevier Interactive Patient Education [employer].

## 2015-11-14 NOTE — Progress Notes (Signed)
   Subjective:    Patient ID: David Cole, male    DOB: 1970/07/19, 45 y.o.   MRN: [id number]  HPI Cough and nasal congestion x 2 weeks. Ran a low fever around 100.3 a few days ago.  Says he has tried Robitussin, Sudafed, NyQuil and cough drops. Sputum has been yellow and brown.  Has had some wheezing.  Coughing a lot at night.    Review of Systems     Objective:   Physical Exam  Constitutional: He is oriented to person, place, and time. He appears well-developed and well-nourished.  HENT:  Head: Normocephalic and atraumatic.  Right Ear: External ear normal.  Left Ear: External ear normal.  Nose: Nose normal.  Mouth/Throat: Oropharynx is clear and moist.  TMs and canals are clear.   Eyes: Conjunctivae and EOM are normal. Pupils are equal, round, and reactive to light.  Neck: Neck supple. No thyromegaly present.  Cardiovascular: Normal rate and normal heart sounds.   Pulmonary/Chest: Effort normal and breath sounds normal.  Lymphadenopathy:    He has no cervical adenopathy.  Neurological: He is alert and oriented to person, place, and time.  Skin: Skin is warm and dry.  Psychiatric: He has a normal mood and affect.       Assessment & Plan:  Acute sinusitis - chest is clear on exam. Will tx with zpack. Call if not better in one week.  Given night time cough medication.

## 2015-11-17 ENCOUNTER — Other Ambulatory Visit: Payer: Self-pay | Admitting: Family Medicine

## 2015-12-04 ENCOUNTER — Telehealth: Payer: Self-pay | Admitting: *Deleted

## 2015-12-04 ENCOUNTER — Other Ambulatory Visit: Payer: Self-pay | Admitting: Family Medicine

## 2015-12-04 DIAGNOSIS — R748 Abnormal levels of other serum enzymes: Secondary | ICD-10-CM

## 2015-12-04 DIAGNOSIS — E785 Hyperlipidemia, unspecified: Secondary | ICD-10-CM

## 2015-12-04 DIAGNOSIS — I10 Essential (primary) hypertension: Secondary | ICD-10-CM

## 2015-12-04 NOTE — Telephone Encounter (Signed)
Pt advised that he will need to get labwork done. Due to the type of medication that he is taking. I informed him of the days and hours of the lab hours and will send the order down. I asked how much medication he had left he stated that he had a few weeks left. I informed him that I would send it however, it was imperative that he get this done. Pt voiced understanding and agreed.Maryruth Eve, Lahoma Crocker

## 2015-12-14 LAB — LIPID PANEL
CHOL/HDL RATIO: 7.6 ratio — AB (ref <=5.0)
Cholesterol: 204 mg/dL — ABNORMAL HIGH (ref 125–200)
HDL: 27 mg/dL — ABNORMAL LOW (ref >=40)
LDL CALC: 109 mg/dL (ref <130)
Triglycerides: 339 mg/dL — ABNORMAL HIGH (ref <150)
VLDL: 68 mg/dL — AB (ref <30)

## 2015-12-14 LAB — COMPLETE METABOLIC PANEL WITH GFR
ALT: 74 U/L — AB (ref 9–46)
AST: 60 U/L — AB (ref 10–40)
Albumin: 4.2 g/dL (ref 3.6–5.1)
Alkaline Phosphatase: 77 U/L (ref 40–115)
BUN: 13 mg/dL (ref 7–25)
CALCIUM: 9.1 mg/dL (ref 8.6–10.3)
CHLORIDE: 99 mmol/L (ref 98–110)
CO2: 26 mmol/L (ref 20–31)
CREATININE: 1.06 mg/dL (ref 0.60–1.35)
GFR, Est African American: >89 mL/min (ref >=60)
GFR, Est Non African American: 84 mL/min (ref >=60)
GLUCOSE: 106 mg/dL — AB (ref 65–99)
POTASSIUM: 3.7 mmol/L (ref 3.5–5.3)
SODIUM: 136 mmol/L (ref 135–146)
Total Bilirubin: 0.7 mg/dL (ref 0.2–1.2)
Total Protein: 7.2 g/dL (ref 6.1–8.1)

## 2015-12-16 NOTE — Addendum Note (Signed)
Addended by: Beatrice Lecher D on: 12/16/2015 10:05 PM   Modules accepted: Orders

## 2015-12-19 ENCOUNTER — Ambulatory Visit (INDEPENDENT_AMBULATORY_CARE_PROVIDER_SITE_OTHER): Payer: BLUE CROSS/BLUE SHIELD

## 2015-12-19 DIAGNOSIS — R748 Abnormal levels of other serum enzymes: Secondary | ICD-10-CM | POA: Diagnosis not present

## 2016-02-12 DIAGNOSIS — G4733 Obstructive sleep apnea (adult) (pediatric): Secondary | ICD-10-CM | POA: Diagnosis not present

## 2016-03-03 ENCOUNTER — Other Ambulatory Visit: Payer: Self-pay | Admitting: Family Medicine

## 2016-03-12 ENCOUNTER — Ambulatory Visit: Payer: BLUE CROSS/BLUE SHIELD | Admitting: Family Medicine

## 2016-03-13 ENCOUNTER — Ambulatory Visit: Payer: Self-pay | Admitting: Family Medicine

## 2016-03-17 ENCOUNTER — Ambulatory Visit (INDEPENDENT_AMBULATORY_CARE_PROVIDER_SITE_OTHER): Payer: BLUE CROSS/BLUE SHIELD | Admitting: Family Medicine

## 2016-03-17 ENCOUNTER — Encounter: Payer: Self-pay | Admitting: Family Medicine

## 2016-03-17 VITALS — BP 129/79 | HR 84 | Ht 69.0 in | Wt 262.0 lb

## 2016-03-17 DIAGNOSIS — R7989 Other specified abnormal findings of blood chemistry: Secondary | ICD-10-CM

## 2016-03-17 DIAGNOSIS — E349 Endocrine disorder, unspecified: Secondary | ICD-10-CM

## 2016-03-17 DIAGNOSIS — G4733 Obstructive sleep apnea (adult) (pediatric): Secondary | ICD-10-CM | POA: Diagnosis not present

## 2016-03-17 DIAGNOSIS — N521 Erectile dysfunction due to diseases classified elsewhere: Secondary | ICD-10-CM | POA: Diagnosis not present

## 2016-03-17 DIAGNOSIS — E119 Type 2 diabetes mellitus without complications: Secondary | ICD-10-CM | POA: Diagnosis not present

## 2016-03-17 DIAGNOSIS — Z9989 Dependence on other enabling machines and devices: Secondary | ICD-10-CM

## 2016-03-17 LAB — CBC WITH DIFFERENTIAL/PLATELET
BASOS PCT: 1 %
Basophils Absolute: 67 cells/uL (ref 0–200)
EOS ABS: 201 {cells}/uL (ref 15–500)
Eosinophils Relative: 3 %
HEMATOCRIT: 43.4 % (ref 38.5–50.0)
HEMOGLOBIN: 14.5 g/dL (ref 13.2–17.1)
LYMPHS ABS: 2010 {cells}/uL (ref 850–3900)
LYMPHS PCT: 30 %
MCH: 28.7 pg (ref 27.0–33.0)
MCHC: 33.4 g/dL (ref 32.0–36.0)
MCV: 85.9 fL (ref 80.0–100.0)
MONO ABS: 469 {cells}/uL (ref 200–950)
MPV: 11.9 fL (ref 7.5–12.5)
Monocytes Relative: 7 %
NEUTROS PCT: 59 %
Neutro Abs: 3953 cells/uL (ref 1500–7800)
Platelets: 209 10*3/uL (ref 140–400)
RBC: 5.05 MIL/uL (ref 4.20–5.80)
RDW: 14.8 % (ref 11.0–15.0)
WBC: 6.7 10*3/uL (ref 3.8–10.8)

## 2016-03-17 LAB — POCT UA - MICROALBUMIN
Albumin/Creatinine Ratio, Urine, POC: <30
Creatinine, POC: 100 mg/dL
Microalbumin Ur, POC: 80 mg/L

## 2016-03-17 LAB — POCT GLYCOSYLATED HEMOGLOBIN (HGB A1C): Hemoglobin A1C: 13.7

## 2016-03-17 MED ORDER — AMBULATORY NON FORMULARY MEDICATION: 99 refills | Status: DC

## 2016-03-17 NOTE — Progress Notes (Addendum)
Subjective:    CC: ED  HPI: He would like to be tested for Diabetes.  Sister has DM. His last glucose earlier in the year was just borderline. He has noticed some increased thirst. Some occasional vision changes. + weight loss recently.  Lab Results  Component Value Date   HGBA1C 13.7 03/17/2016     Thinks has external hemorrhoid but now resolved.   He says he also like to have his testosterone levels checked. He's noticed more difficulty with maintaining erections. He denies any decrease in libido. He says his symptoms started about 6 months ago. He feels that his relationship is good with his wife. He denies any significant stressors. He says overall he sleeps well and he does wear his CPAP regularly. No family history of thyroid disorders. No recent medication changes.  Obstructive sleep apnea-using CPAP regularly. He says it has not been checked since we adjusted his pressure to 12 7 m of water pressure.  BP 129/79   Pulse 84   Ht '5\' 9"'  (1.753 m)   Wt 262 lb (118.8 kg)   SpO2 98%   BMI 38.69 kg/m     No Known Allergies  Past Medical History:  Diagnosis Date  . Hypertension     No past surgical history on file.  Social History   Social History  . Marital status: Married    Spouse name: [name]  . Number of children: 1  . Years of education: N/A   Occupational History  . Billing specialist [employer]   Social History Main Topics  . Smoking status: Never Smoker  . Smokeless tobacco: Not on file  . Alcohol use 0.0 oz/week    1 - 3 drink(s) per week     Comment: per week  . Drug use: No  . Sexual activity: Not on file   Other Topics Concern  . Not on file   Social History Narrative   1 caffeine drink per day. Some college     Family History  Problem Relation Age of Onset  . Alcohol abuse      GF    Outpatient Encounter Prescriptions as of 03/17/2016  Medication Sig  . AMBULATORY NON FORMULARY MEDICATION Medication Name:  Please get download  of CPAP.  We changed his pressure to 12 some time ago.  Marland Kitchen  [facility] Fax to [phone]  Dx OSA.  . metoprolol succinate (TOPROL-XL) 100 MG 24 hr tablet Take 1 tablet (100 mg total) by mouth daily. Take with or immediately following a meal.  . Olmesartan-Amlodipine-HCTZ 40-5-25 MG TABS Take 1 tablet by mouth daily. NEED A FOLLOW UP VISIT IN [facility] FOR MORE REFILLS  . omeprazole (PRILOSEC) 40 MG capsule Take 1 capsule (40 mg total) by mouth daily.  . [DISCONTINUED] AMBULATORY NON FORMULARY MEDICATION Medication Name:  CPAP therapy on 12 cm.  [facility] Fax to [phone]  Dx OSA.  . [DISCONTINUED] HYDROcodone-homatropine (HYCODAN) 5-1.5 MG/5ML syrup Take 5 mLs by mouth at bedtime as needed for cough.   No facility-administered encounter medications on file as of 03/17/2016.         Review of Systems: No fevers, chills, night sweats, weight loss, chest pain, or shortness of breath.   Objective:    General: Well Developed, well nourished, and in no acute distress.  Neuro: Alert and oriented x3, extra-ocular muscles intact, sensation grossly intact.  HEENT: Normocephalic, atraumatic  Skin: Warm and dry, no rashes. Cardiac: Regular rate and rhythm, no murmurs rubs or gallops,  no lower extremity edema.  Respiratory: Clear to auscultation bilaterally. Not using accessory muscles, speaking in full sentences.   Impression and Recommendations:    Diabetes-hemoglobin A1c at 13.7 today. I like to get of the new puncture for confirmation since he has had no prior diagnosis and his glucose was only 102 when last checked at 6 months. In the short interim encourage him to avoid any concentrated sweets and watch his portion sizes on carbohydrates intake. Make sure drinking plenty of water and avoid sweetened beverages.  Erectile dysfunction-could certainly be related to recent onset of diabetes. Explained that this could actually be one of the major causes of his recent symptoms. We will do some blood  work including checking a testosterone level. And can consider prescription medication for symptoms.  Obstructive sleep apnea-need to call for download off his CPAP.  Addendum: lab confirmed A1C.  Refer for teaching, rx for glucometer kit sent, and start Janumet.

## 2016-03-18 LAB — COMPLETE METABOLIC PANEL WITH GFR
ALBUMIN: 4.6 g/dL (ref 3.6–5.1)
ALK PHOS: 95 U/L (ref 40–115)
ALT: 53 U/L — ABNORMAL HIGH (ref 9–46)
AST: 35 U/L (ref 10–40)
BILIRUBIN TOTAL: 0.8 mg/dL (ref 0.2–1.2)
BUN: 14 mg/dL (ref 7–25)
CO2: 27 mmol/L (ref 20–31)
Calcium: 9.6 mg/dL (ref 8.6–10.3)
Chloride: 94 mmol/L — ABNORMAL LOW (ref 98–110)
Creat: 1.1 mg/dL (ref 0.60–1.35)
GFR, Est African American: >89 mL/min (ref >=60)
GFR, Est Non African American: 81 mL/min (ref >=60)
GLUCOSE: 351 mg/dL — AB (ref 65–99)
Potassium: 4.1 mmol/L (ref 3.5–5.3)
SODIUM: 132 mmol/L — AB (ref 135–146)
TOTAL PROTEIN: 7.5 g/dL (ref 6.1–8.1)

## 2016-03-18 LAB — TESTOSTERONE: TESTOSTERONE: 215 ng/dL — AB (ref 250–827)

## 2016-03-18 LAB — HEMOGLOBIN A1C
Hgb A1c MFr Bld: 13.9 % — ABNORMAL HIGH (ref <5.7)
Mean Plasma Glucose: 352 mg/dL

## 2016-03-18 MED ORDER — SITAGLIPTIN PHOS-METFORMIN HCL 50-1000 MG PO TABS: 1.0000 | ORAL_TABLET | Freq: Two times a day (BID) | ORAL | 5 refills | Status: DC

## 2016-03-18 MED ORDER — FREESTYLE SYSTEM KIT: 1.0000 | PACK | 99 refills | Status: AC | PRN

## 2016-03-18 NOTE — Addendum Note (Signed)
Addended by: Teddy Spike on: 03/18/2016 12:48 PM   Modules accepted: Orders

## 2016-03-18 NOTE — Addendum Note (Signed)
Addended by: Beatrice Lecher D on: 03/18/2016 11:09 AM   Modules accepted: Orders

## 2016-03-20 ENCOUNTER — Other Ambulatory Visit: Payer: Self-pay

## 2016-03-20 ENCOUNTER — Other Ambulatory Visit: Payer: Self-pay | Admitting: *Deleted

## 2016-03-20 MED ORDER — GLUCOSE BLOOD VI STRP: ORAL_STRIP | 99 refills | Status: DC

## 2016-03-20 MED ORDER — FREESTYLE LANCETS MISC: 99 refills | Status: DC

## 2016-03-30 ENCOUNTER — Other Ambulatory Visit: Payer: Self-pay

## 2016-03-30 DIAGNOSIS — R7989 Other specified abnormal findings of blood chemistry: Secondary | ICD-10-CM

## 2016-04-09 DIAGNOSIS — E349 Endocrine disorder, unspecified: Secondary | ICD-10-CM | POA: Diagnosis not present

## 2016-04-09 DIAGNOSIS — R7989 Other specified abnormal findings of blood chemistry: Secondary | ICD-10-CM | POA: Diagnosis not present

## 2016-04-09 LAB — BASIC METABOLIC PANEL WITHOUT GFR
BUN: 10 mg/dL (ref 7–25)
CO2: 26 mmol/L (ref 20–31)
Calcium: 9.5 mg/dL (ref 8.6–10.3)
Chloride: 103 mmol/L (ref 98–110)
Creat: 0.94 mg/dL (ref 0.60–1.35)
GFR, Est African American: >89 mL/min
GFR, Est Non African American: >89 mL/min
Glucose, Bld: 74 mg/dL (ref 65–99)
Potassium: 3.8 mmol/L (ref 3.5–5.3)
Sodium: 140 mmol/L (ref 135–146)

## 2016-04-10 LAB — TESTOSTERONE: Testosterone: 198 ng/dL — ABNORMAL LOW (ref 250–827)

## 2016-04-20 ENCOUNTER — Ambulatory Visit: Payer: BLUE CROSS/BLUE SHIELD | Admitting: Family Medicine

## 2016-04-29 ENCOUNTER — Telehealth: Payer: Self-pay | Admitting: Family Medicine

## 2016-04-29 NOTE — Telephone Encounter (Signed)
Mr. David Cole would like to ask if his daughter could be switched to you for her pcp. I did notify that it would be a possibility since she is immediate family but still it would be up to your discretion. Please give a call to patient to notify of descision.

## 2016-04-30 NOTE — Telephone Encounter (Signed)
Pt is [employer]. Let's chat about her when you have a moment.Marland KitchenMarland Kitchen

## 2016-04-30 NOTE — Telephone Encounter (Signed)
[place] with me if ok with Dr. Sheppard Coil. I see mom and dad.  Beatrice Lecher, MD

## 2016-05-01 ENCOUNTER — Telehealth: Payer: Self-pay

## 2016-05-01 NOTE — Telephone Encounter (Signed)
OK to change PCP.  Please enter on patients chart instead of Dad's chart.

## 2016-05-01 NOTE — Telephone Encounter (Signed)
Opened in error. Rhonda Cunningham,CMA  

## 2016-05-04 ENCOUNTER — Encounter: Payer: Self-pay | Admitting: Family Medicine

## 2016-05-04 ENCOUNTER — Ambulatory Visit (INDEPENDENT_AMBULATORY_CARE_PROVIDER_SITE_OTHER): Payer: BLUE CROSS/BLUE SHIELD | Admitting: Family Medicine

## 2016-05-04 VITALS — BP 126/74 | HR 71 | Wt 267.0 lb

## 2016-05-04 DIAGNOSIS — I10 Essential (primary) hypertension: Secondary | ICD-10-CM | POA: Diagnosis not present

## 2016-05-04 DIAGNOSIS — E119 Type 2 diabetes mellitus without complications: Secondary | ICD-10-CM

## 2016-05-04 DIAGNOSIS — E291 Testicular hypofunction: Secondary | ICD-10-CM | POA: Insufficient documentation

## 2016-05-04 DIAGNOSIS — E349 Endocrine disorder, unspecified: Secondary | ICD-10-CM

## 2016-05-04 DIAGNOSIS — E1169 Type 2 diabetes mellitus with other specified complication: Secondary | ICD-10-CM | POA: Insufficient documentation

## 2016-05-04 DIAGNOSIS — R7989 Other specified abnormal findings of blood chemistry: Secondary | ICD-10-CM

## 2016-05-04 MED ORDER — SITAGLIPTIN PHOS-METFORMIN HCL 50-1000 MG PO TABS: 1.0000 | ORAL_TABLET | Freq: Two times a day (BID) | ORAL | 1 refills | Status: DC

## 2016-05-04 MED ORDER — GLUCOSE BLOOD VI STRP: ORAL_STRIP | 11 refills | Status: DC

## 2016-05-04 MED ORDER — FREESTYLE LANCETS MISC: 11 refills | Status: DC

## 2016-05-04 NOTE — Progress Notes (Signed)
Subjective:    CC: DM  HPI:  Diabetes - no hypoglycemic events. No wounds or sores that are not healing well. No increased thirst or urination. Checking glucose at home. Taking medications as prescribed without any side effects. Brought in home log.  Sugars have been running between 8108 which absolutely looks fantastic. He's not had any hypoglycemic events. He has started exercising 3 days a week on the treadmill.  Hypertension- Pt denies chest pain, SOB, dizziness, or heart palpitations.  Taking meds as directed w/o problems.  Denies medication side effects.    Low testosterone - we did do 2 labs checking his testosterone levels and both were borderline low. We reviewed the results together today and he wanted to discuss options.  Past medical history, Surgical history, Family history not pertinant except as noted below, Social history, Allergies, and medications have been entered into the medical record, reviewed, and corrections made.   Review of Systems: No fevers, chills, night sweats, weight loss, chest pain, or shortness of breath.   Objective:    General: Well Developed, well nourished, and in no acute distress.  Neuro: Alert and oriented x3, extra-ocular muscles intact, sensation grossly intact.  HEENT: Normocephalic, atraumatic  Skin: Warm and dry, no rashes. Cardiac: Regular rate and rhythm, no murmurs rubs or gallops, no lower extremity edema.  Respiratory: Clear to auscultation bilaterally. Not using accessory muscles, speaking in full sentences.   Impression and Recommendations:    DM- Her for his next A1c will absolutely fantastic. According to his blood sugar log should be within the normal range.  HTN - .Well controlled. Continue current regimen. Follow up in  3-4 months.    Hypogonadism-reviewed lab results with him today and discussed treatment options. I think right now would like to really work on his getting his blood sugars under control and some weight loss  and regular exercise and see if this brings testosterone levels up over the next 4-6 months. If not then we can certainly consider hormone replacement therapy. We did discuss these options today.

## 2016-05-31 ENCOUNTER — Other Ambulatory Visit: Payer: Self-pay | Admitting: Family Medicine

## 2016-06-09 ENCOUNTER — Other Ambulatory Visit: Payer: Self-pay | Admitting: Family Medicine

## 2016-06-16 ENCOUNTER — Ambulatory Visit (INDEPENDENT_AMBULATORY_CARE_PROVIDER_SITE_OTHER): Payer: BLUE CROSS/BLUE SHIELD | Admitting: Family Medicine

## 2016-06-16 ENCOUNTER — Encounter: Payer: Self-pay | Admitting: Family Medicine

## 2016-06-16 VITALS — BP 121/53 | HR 65 | Ht 69.0 in | Wt 273.0 lb

## 2016-06-16 DIAGNOSIS — M79672 Pain in left foot: Secondary | ICD-10-CM

## 2016-06-16 DIAGNOSIS — R29898 Other symptoms and signs involving the musculoskeletal system: Secondary | ICD-10-CM | POA: Diagnosis not present

## 2016-06-16 DIAGNOSIS — E119 Type 2 diabetes mellitus without complications: Secondary | ICD-10-CM | POA: Diagnosis not present

## 2016-06-16 DIAGNOSIS — I10 Essential (primary) hypertension: Secondary | ICD-10-CM | POA: Diagnosis not present

## 2016-06-16 LAB — POCT GLYCOSYLATED HEMOGLOBIN (HGB A1C): HEMOGLOBIN A1C: 6

## 2016-06-16 NOTE — Progress Notes (Signed)
Subjective:    CC: DM  HPI:  Diabetes - no hypoglycemic events. No wounds or sores that are not healing well. No increased thirst or urination. Checking glucose at home. Taking medications as prescribed without any side effects.He has gained back about 6 pounds since I last saw him.    Hypertension- Pt denies chest pain, SOB, dizziness, or heart palpitations.  Taking meds as directed w/o problems.  Denies medication side effects.  He actually hasn't taken his blood pressure medication today.   Left heel pain has been having some recurrent posterior heel pain for the last week or so. He has a prior history of heel spurs and has had to do therapy and treatment for those in the past. He says that that typically has helped in the past and usually after couple weeks of doing the stretches it seems to get better on its own. He denies any known injury or trauma.  He also has an area that's protruding over the epigastric area. He went online and says that he thinks is his xiphoid process and thinks it may just be protruding more because of his weight. But he wanted me to take a look at it.  Past medical history, Surgical history, Family history not pertinant except as noted below, Social history, Allergies, and medications have been entered into the medical record, reviewed, and corrections made.   Review of Systems: No fevers, chills, night sweats, weight loss, chest pain, or shortness of breath.   Objective:    General: Well Developed, well nourished, and in no acute distress.  Neuro: Alert and oriented x3, extra-ocular muscles intact, sensation grossly intact.  HEENT: Normocephalic, atraumatic  Skin: Warm and dry, no rashes. Cardiac: Regular rate and rhythm, no murmurs rubs or gallops, no lower extremity edema.  Respiratory: Clear to auscultation bilaterally. Not using accessory muscles, speaking in full sentences. Musculoskeletal-he does have a very prominent xiphoid process. Terrence Dupont seems a  little shifted to the right as well. Nontender on exam.   Impression and Recommendations:    DM- Well controlled. Hemoccult A1c looks fantastic at 6.0 which is down from previous of 13.9. We'll do a foot exam today. He is doing absolutely wonderful. Fasting blood sugars have ranged from 77 to the 90s. Will discuss pneumonia vaccine at next office visit.   HTN - well controlled. Will have him go ahead and cut his metoprolol in half. Follow-up in 3 months.  Left heel pain-likely from heel spur. Encouraged him to restart his stretches that he was doing previously and if it's not improving over the next 2-3 weeks to get him in with one of our sports medicine doctors.  He also has a lesion on his right eyelid that he would like me to look at today. I think he would benefit from cryotherapy as it does look like a flat skin tag. Encouraged him to schedule at a later date.  Xiphoid process-explained that sometimes he can protrude a little bit. And it may change somewhat as he loses weight. We can always get an x-ray for confirmation since it does seem a little bit large. Will check it again when I see him back in 3 months.

## 2016-06-16 NOTE — Patient Instructions (Addendum)
Cut metoprolol in half and take a half a tab daily until I see you back in 3 months. If any point you feel like her blood pressure may still be going low you're always welcome to come in and schedule a nurse visit to have it checked. Go ahead and start doing her stretches again for your heel spur. If not improving over the next 2-3 weeks and please schedule appointment with one of our sports medicine providers for further treatment options.

## 2016-06-18 ENCOUNTER — Ambulatory Visit: Payer: BLUE CROSS/BLUE SHIELD | Admitting: Family Medicine

## 2016-08-28 DIAGNOSIS — E041 Nontoxic single thyroid nodule: Secondary | ICD-10-CM | POA: Diagnosis not present

## 2016-09-07 DIAGNOSIS — G4733 Obstructive sleep apnea (adult) (pediatric): Secondary | ICD-10-CM | POA: Diagnosis not present

## 2016-09-10 ENCOUNTER — Other Ambulatory Visit: Payer: Self-pay | Admitting: Family Medicine

## 2016-09-15 ENCOUNTER — Ambulatory Visit: Payer: BLUE CROSS/BLUE SHIELD | Admitting: Family Medicine

## 2016-10-19 ENCOUNTER — Other Ambulatory Visit: Payer: Self-pay | Admitting: Family Medicine

## 2016-10-19 DIAGNOSIS — E119 Type 2 diabetes mellitus without complications: Secondary | ICD-10-CM

## 2016-11-28 ENCOUNTER — Other Ambulatory Visit: Payer: Self-pay | Admitting: Family Medicine

## 2016-12-15 ENCOUNTER — Other Ambulatory Visit: Payer: Self-pay

## 2016-12-15 DIAGNOSIS — E119 Type 2 diabetes mellitus without complications: Secondary | ICD-10-CM

## 2016-12-15 MED ORDER — SITAGLIPTIN PHOS-METFORMIN HCL 50-1000 MG PO TABS: 1.0000 | ORAL_TABLET | Freq: Two times a day (BID) | ORAL | 0 refills | Status: DC

## 2016-12-15 NOTE — Progress Notes (Signed)
Sent in one refill for Pt, transferred to scheduler to make appointment.

## 2017-01-01 ENCOUNTER — Telehealth: Payer: Self-pay | Admitting: *Deleted

## 2017-01-01 ENCOUNTER — Ambulatory Visit (INDEPENDENT_AMBULATORY_CARE_PROVIDER_SITE_OTHER): Payer: BLUE CROSS/BLUE SHIELD | Admitting: Family Medicine

## 2017-01-01 ENCOUNTER — Encounter: Payer: Self-pay | Admitting: Family Medicine

## 2017-01-01 VITALS — BP 138/79 | HR 84 | Ht 69.0 in | Wt 280.0 lb

## 2017-01-01 DIAGNOSIS — G4733 Obstructive sleep apnea (adult) (pediatric): Secondary | ICD-10-CM | POA: Diagnosis not present

## 2017-01-01 DIAGNOSIS — I10 Essential (primary) hypertension: Secondary | ICD-10-CM | POA: Diagnosis not present

## 2017-01-01 DIAGNOSIS — E119 Type 2 diabetes mellitus without complications: Secondary | ICD-10-CM

## 2017-01-01 DIAGNOSIS — J358 Other chronic diseases of tonsils and adenoids: Secondary | ICD-10-CM

## 2017-01-01 DIAGNOSIS — R0981 Nasal congestion: Secondary | ICD-10-CM

## 2017-01-01 DIAGNOSIS — Z9989 Dependence on other enabling machines and devices: Secondary | ICD-10-CM

## 2017-01-01 LAB — POCT GLYCOSYLATED HEMOGLOBIN (HGB A1C): Hemoglobin A1C: 6.1

## 2017-01-01 MED ORDER — FLUTICASONE PROPIONATE 50 MCG/ACT NA SUSP: 2.0000 | Freq: Every day | NASAL | 1 refills | Status: DC

## 2017-01-01 MED ORDER — AMBULATORY NON FORMULARY MEDICATION: 0 refills | Status: DC

## 2017-01-01 NOTE — Patient Instructions (Signed)
Let me know if it some point you want to try to decrease her adjust the pressure on her machine. Again we can call the company and have them set it on AutoPap for 2 weeks and see if your pressure can actually be lowered or adjusted.

## 2017-01-01 NOTE — Progress Notes (Addendum)
Subjective:    CC:   HPI: Diabetes - no hypoglycemic events. No wounds or sores that are not healing well. No increased thirst or urination. Checking glucose at home. Taking medications as prescribed without any side effects.  Hypertension- Pt denies chest pain, SOB, dizziness, or heart palpitations.  Taking meds as directed w/o problems.  Denies medication side effects.    OSA - he is using his CPAP regularly. His machines about 46 years old. He wondered if there was anything new on the market. He still sometimes get some leaks across the nasal bridge from his facemask plus he does have some facial hair. He gets good downloads on his phone and showed me the last couple of weeks where he has had a great AHI. He has had some nasal congestion with green nasal discharge for about 2 weeks. A lot of facial pressure initially but that has actually gotten better. No fevers chills or sweats.  Past medical history, Surgical history, Family history not pertinant except as noted below, Social history, Allergies, and medications have been entered into the medical record, reviewed, and corrections made.   Review of Systems: No fevers, chills, night sweats, weight loss, chest pain, or shortness of breath.   Objective:    General: Well Developed, well nourished, and in no acute distress.  Neuro: Alert and oriented x3, extra-ocular muscles intact, sensation grossly intact.  HEENT: Normocephalic, atraumatic, oropharynx is clear, TMs and canals are clear bilaterally. No significant cervical lymphadenopathy.  Skin: Warm and dry, no rashes. Cardiac: Regular rate and rhythm, no murmurs rubs or gallops, no lower extremity edema.  Respiratory: Clear to auscultation bilaterally. Not using accessory muscles, speaking in full sentences.   Impression and Recommendations:    DM- Well controlled. Continue current regimen. Follow up in  40months.  A1C of 6.1 today.  Encouraged to get eye exam.    HTN - Well controlled.  Continue current regimen. Follow up in  6 months.    OSA - well controlled.  He would like to see if his pressure could be adjusted hopefully down. Will call the company and have them set him on AutoPap for 2 weeks and see what the reading is and then see if we might yield to adjust his pressure downward.  Nasal congestion-recommend a trial of nasal saline rinse plus fluticasone nasal spray to help with swelling. If he is not feeling better after 5 or 6 days and please call back and consider treating for acute sinusitis.  He also wanted to know what to do about tonsillar stones-he can use a WaterPik to help irrigate the area but did not recommend trying to pick at or debris the area with an instrument.  Discussed the need for tdap and Pneumovax 23. He wants to check with his insurance on coverage so we gave him some additional information 3 recall his insurance for verification.

## 2017-01-01 NOTE — Telephone Encounter (Signed)
Before pt left the office today he stated that he would like to move forward with getting the download for his cpap done. Will fwd to pcp .Audelia Hives [place]

## 2017-01-04 NOTE — Telephone Encounter (Signed)
Order place to have him set onAutopap on his CPAP and then have downloaded in 2 weeks. Please call for download in 2 weeks.  Order should have been faxed already.

## 2017-01-05 NOTE — Telephone Encounter (Signed)
Spoke w/justin at [employer] and he faxed over order for titration with download. This was signed and faxed back, confirmation received.David Cole

## 2017-01-07 DIAGNOSIS — G4733 Obstructive sleep apnea (adult) (pediatric): Secondary | ICD-10-CM | POA: Diagnosis not present

## 2017-01-27 ENCOUNTER — Telehealth: Payer: Self-pay | Admitting: Family Medicine

## 2017-01-27 NOTE — Telephone Encounter (Signed)
Patient states he has not even taken the CPAP out of the box. He states he will set it up soon.

## 2017-01-27 NOTE — Telephone Encounter (Signed)
Please call patient. Receive his CPAP download. Usage overall looks good. His current pressure is set to 12 cm of water pressure and his AHI is 1.4 which looks fantastic. That means his CPAP should be working well. If he is having any problems and please let us know.

## 2017-02-07 DIAGNOSIS — G4733 Obstructive sleep apnea (adult) (pediatric): Secondary | ICD-10-CM | POA: Diagnosis not present

## 2017-02-14 ENCOUNTER — Other Ambulatory Visit: Payer: Self-pay | Admitting: Family Medicine

## 2017-03-10 DIAGNOSIS — G4733 Obstructive sleep apnea (adult) (pediatric): Secondary | ICD-10-CM | POA: Diagnosis not present

## 2017-03-18 ENCOUNTER — Other Ambulatory Visit: Payer: Self-pay | Admitting: Family Medicine

## 2017-03-18 DIAGNOSIS — E119 Type 2 diabetes mellitus without complications: Secondary | ICD-10-CM

## 2017-04-10 DIAGNOSIS — G4733 Obstructive sleep apnea (adult) (pediatric): Secondary | ICD-10-CM | POA: Diagnosis not present

## 2017-05-06 ENCOUNTER — Encounter: Payer: Self-pay | Admitting: Family Medicine

## 2017-05-06 ENCOUNTER — Ambulatory Visit (INDEPENDENT_AMBULATORY_CARE_PROVIDER_SITE_OTHER): Payer: BLUE CROSS/BLUE SHIELD

## 2017-05-06 ENCOUNTER — Ambulatory Visit: Payer: BLUE CROSS/BLUE SHIELD | Admitting: Family Medicine

## 2017-05-06 VITALS — BP 124/60 | HR 83 | Ht 69.0 in | Wt 279.0 lb

## 2017-05-06 DIAGNOSIS — M19042 Primary osteoarthritis, left hand: Secondary | ICD-10-CM | POA: Diagnosis not present

## 2017-05-06 DIAGNOSIS — E291 Testicular hypofunction: Secondary | ICD-10-CM | POA: Diagnosis not present

## 2017-05-06 DIAGNOSIS — M79645 Pain in left finger(s): Secondary | ICD-10-CM

## 2017-05-06 DIAGNOSIS — Z13220 Encounter for screening for lipoid disorders: Secondary | ICD-10-CM | POA: Diagnosis not present

## 2017-05-06 NOTE — Progress Notes (Signed)
Subjective:    Patient ID: David Cole, male    DOB: 16-Jun-1970, 47 y.o.   MRN: [id number]  HPI 47 year old male here today to discuss low testosterone.  He had 2 low testosterone levels  about year ago which confirmed hypogonadism.  He would qualify for testosterone replacement.  At the time he did not decide to do any specific treatment except for work on regular exercise and weight loss..  Swelling in the tip of the  left ring finger x 2 months. He bowls regularly andafter bowling it is very swollen and feels numb. It is hard to make a fist. .  He feels like there is something moving around in it.     Review of Systems  BP 124/60   Pulse 83   Ht 5' 9" (1.753 m)   Wt 279 lb (126.6 kg)   SpO2 100%   BMI 41.20 kg/m     No Known Allergies  Past Medical History:  Diagnosis Date  . Hypertension     History reviewed. No pertinent surgical history.  Social History   Socioeconomic History  . Marital status: Married    Spouse name: [name]  . Number of children: 1  . Years of education: Not on file  . Highest education level: Not on file  Social Needs  . Financial resource strain: Not on file  . Food insecurity - worry: Not on file  . Food insecurity - inability: Not on file  . Transportation needs - medical: Not on file  . Transportation needs - non-medical: Not on file  Occupational History  . Occupation: Tax inspector: [employer]  Tobacco Use  . Smoking status: Never Smoker  . Smokeless tobacco: Never Used  Substance and Sexual Activity  . Alcohol use: Yes    Alcohol/week: 0.0 oz    Types: 1 - 3 Standard drinks or equivalent per week    Comment: per week  . Drug use: No  . Sexual activity: Not on file  Other Topics Concern  . Not on file  Social History Narrative   1 caffeine drink per day. Some college     Family History  Problem Relation Age of Onset  . Alcohol abuse Unknown        GF    Outpatient Encounter Medications as  of 05/06/2017  Medication Sig  . AMBULATORY NON FORMULARY MEDICATION Medication Name:  Please get download of CPAP.  We changed his pressure to 12 some time ago.  Marland Kitchen  Aeroflow Fax to [phone]  Dx OSA.  Marland Kitchen AMBULATORY NON FORMULARY MEDICATION Medication Name: Set CPAP to Autopap for 2 weeks and then fax download so we can see if need to adjust pressure.  I think he uses Aeroflow.  Marland Kitchen glucose blood test strip Freestyle - Check fasting blood glucose daily.  Marland Kitchen glucose monitoring kit (FREESTYLE) monitoring kit 1 each by Does not apply route as needed for other. Dx Diabetes type 2.  . Lancets (FREESTYLE) lancets Check fasting blood glucose daily.  . metoprolol succinate (TOPROL-XL) 100 MG 24 hr tablet TAKE 1 TABLET DAILY WITH OR IMMEDIATELY FOLLOWING A MEAL  . Olmesartan-Amlodipine-HCTZ 40-5-25 MG TABS Take 1 tablet by mouth daily.  . sitaGLIPtin-metformin (JANUMET) 50-1000 MG tablet Take 1 tablet by mouth 2 (two) times daily with a meal.  . [DISCONTINUED] fluticasone (FLONASE) 50 MCG/ACT nasal spray Place 2 sprays into both nostrils daily.   No facility-administered encounter medications on file as of 05/06/2017.  Objective:   Physical Exam  Constitutional: He is oriented to person, place, and time. He appears well-developed and well-nourished.  HENT:  Head: Normocephalic and atraumatic.  Cardiovascular: Normal rate, regular rhythm and normal heart sounds.  Pulmonary/Chest: Effort normal and breath sounds normal.  Neurological: He is alert and oriented to person, place, and time.  Skin: Skin is warm and dry.  Psychiatric: He has a normal mood and affect. His behavior is normal.        Assessment & Plan:  Male hypogonadism-discussed diagnosis.  I would like to do additional labs including an [place] and LH and prolactin level.  We discussed different types of testosterone replacement therapies and showed him the demo dispensers.  Encouraged him to think about it.  He may want to check  with his insurance to see what might be best covered.  He will let me know.  We also discussed potential side effects of testosterone replacement including increased risk for cardiac disease and having to monitor for prostate cancer with PSA levels.  Left ring finger stiffness and swelling-unclear if he may have actually experienced a fracture or a tendon and may be injured.  We will get an x-ray today for further workup.  No sign of fracture then we will get him in with 1 of our sports medicine providers for further treatment options.

## 2017-05-07 DIAGNOSIS — E291 Testicular hypofunction: Secondary | ICD-10-CM | POA: Diagnosis not present

## 2017-05-07 DIAGNOSIS — Z13220 Encounter for screening for lipoid disorders: Secondary | ICD-10-CM | POA: Diagnosis not present

## 2017-05-07 LAB — LIPID PANEL W/REFLEX DIRECT LDL
CHOL/HDL RATIO: 6.5 (calc) — AB (ref <5.0)
CHOLESTEROL: 188 mg/dL (ref <200)
HDL: 29 mg/dL — AB (ref >40)
LDL CHOLESTEROL (CALC): 115 mg/dL — AB
Non-HDL Cholesterol (Calc): 159 mg/dL (calc) — ABNORMAL HIGH (ref <130)
Triglycerides: 328 mg/dL — ABNORMAL HIGH (ref <150)

## 2017-05-08 DIAGNOSIS — G4733 Obstructive sleep apnea (adult) (pediatric): Secondary | ICD-10-CM | POA: Diagnosis not present

## 2017-05-08 LAB — LUTEINIZING HORMONE: LH: 1.5 m[IU]/mL (ref 1.5–9.3)

## 2017-05-08 LAB — PROLACTIN: PROLACTIN: 7.6 ng/mL (ref 2.0–18.0)

## 2017-05-08 LAB — TESTOSTERONE: Testosterone: 160 ng/dL — ABNORMAL LOW (ref 250–827)

## 2017-05-08 LAB — FOLLICLE STIMULATING HORMONE: FSH: 3 m[IU]/mL (ref 1.6–8.0)

## 2017-05-11 ENCOUNTER — Telehealth: Payer: Self-pay

## 2017-05-11 ENCOUNTER — Other Ambulatory Visit: Payer: Self-pay | Admitting: Family Medicine

## 2017-05-11 DIAGNOSIS — E119 Type 2 diabetes mellitus without complications: Secondary | ICD-10-CM

## 2017-05-11 MED ORDER — ATORVASTATIN CALCIUM 20 MG PO TABS: 20.0000 mg | ORAL_TABLET | Freq: Every day | ORAL | 3 refills | Status: DC

## 2017-05-11 MED ORDER — TESTOSTERONE 10 MG/ACT (2%) TD GEL: TRANSDERMAL | 5 refills | Status: DC

## 2017-05-11 NOTE — Progress Notes (Signed)
Sent to [facility] but he had two listed,  so I just picked 1.

## 2017-05-11 NOTE — Telephone Encounter (Signed)
Dahl agreed to take the testosterone replacement. He called his insurance and they gave if the name of the medications on the preferred list. Androgel - Testin - Fortesta. He would like a generic if possible.

## 2017-05-11 NOTE — Telephone Encounter (Signed)
Call pt: med sent to [facility] for Fortesta.

## 2017-05-12 ENCOUNTER — Ambulatory Visit: Payer: BLUE CROSS/BLUE SHIELD | Admitting: Family Medicine

## 2017-05-12 ENCOUNTER — Encounter: Payer: Self-pay | Admitting: Family Medicine

## 2017-05-12 VITALS — BP 126/77 | HR 77 | Ht 69.0 in | Wt 273.0 lb

## 2017-05-12 DIAGNOSIS — M19049 Primary osteoarthritis, unspecified hand: Secondary | ICD-10-CM | POA: Insufficient documentation

## 2017-05-12 DIAGNOSIS — G5782 Other specified mononeuropathies of left lower limb: Secondary | ICD-10-CM | POA: Insufficient documentation

## 2017-05-12 DIAGNOSIS — M25642 Stiffness of left hand, not elsewhere classified: Secondary | ICD-10-CM

## 2017-05-12 DIAGNOSIS — G5762 Lesion of plantar nerve, left lower limb: Secondary | ICD-10-CM

## 2017-05-12 MED ORDER — DICLOFENAC SODIUM 1 % TD GEL: 2.0000 g | Freq: Four times a day (QID) | TRANSDERMAL | 11 refills | Status: DC

## 2017-05-12 NOTE — Patient Instructions (Signed)
Thank you for coming in today. Apply the diclofenac gel up to 4x daily to the finger.  If not better next step is hand PT.  Call or send a message if needed.  Use the tape as needed for a few weeks to pad and protect the finger.  You can also get a STAX splint size 6 or maybe 5.5 if you can find it online if needed.  Recheck with me in 4 weeks if not better.   You had a dexamethasone injection around the nerve and into the joint.  We are trying to treat a possible neuroma.   You do not have a morton neuroma which occurs in the foot but it is similar.   Call or go to the ER if you develop a large red swollen joint with extreme pain or oozing puss.   Morton Neuralgia Morton neuralgia is a type of foot pain in the area closest to your toes. This area is sometimes called the ball of your foot. Morton neuralgia occurs when a branch of a nerve in your foot (digital nerve) becomes compressed. When this happens over a long period of time, the nerve can thicken (neuroma) and cause pain. This usually occurs between the third and fourth toe. Morton neuralgia can come and go but may get worse over time. What are the causes? Your digital nerve can become compressed and stretched at a point where it passes under a thick band of tissue that connects your toes (intermetatarsal ligament). Morton neuralgia can be caused by mild repetitive damage in this area. This type of damage can result from:  Activities such as running or jumping.  Wearing shoes that are too tight.  What increases the risk? You may be at risk for Morton neuralgia if you:  Are male.  Wear high heels.  Wear shoes that are narrow or tight.  Participate in activities that stretch your toes. These include: ? Running. ? [place]. ? Long-distance walking.  What are the signs or symptoms? The first symptom of Morton neuralgia is pain that spreads from the ball of your foot to your toes. It may feel like you are walking on a marble.  Pain usually gets worse with walking and goes away at night. Other symptoms may include numbness and cramping of your toes. How is this diagnosed? Your health care provider will do a physical exam. When doing the exam, your health care provider may:  Squeeze your foot just behind your toe.  Ask you to move your toes to check for pain.  You may also have tests on your foot to confirm the diagnosis. These may include:  An X-ray.  An MRI.  How is this treated? Treatment for Morton neuralgia may be as simple as changing the kind of shoes you wear. Other treatments may include:  Wearing a supportive pad (orthosis) under the front of your foot. This lifts your toe bones and takes pressure off the nerve.  Getting injections of numbing medicine and anti-inflammatory medicine (steroid) in the nerve.  Having surgery to remove part of the thickened nerve.  Follow these instructions at home:  Take medicine only as directed by your health care provider.  Wear soft-soled shoes with a wide toe area.  Stop activities that may be causing pain.  Elevate your foot when resting.  Massage your foot.  Apply ice to the injured area: ? Put ice in a plastic bag. ? Place a towel between your skin and the bag. ? Leave the  ice on for 20 minutes, 2-3 times a day.  Keep all follow-up visits as directed by your health care provider. This is important. Contact a health care provider if:  Home care instructions are not helping you get better.  Your symptoms change or get worse. This information is not intended to replace advice given to you by your health care provider. Make sure you discuss any questions you have with your health care provider. Document Released: 05/25/2000 Document Revised: 07/25/2015 Document Reviewed: 04/19/2013 Elsevier Interactive Patient Education  [employer].

## 2017-05-12 NOTE — Telephone Encounter (Signed)
Patient advised.

## 2017-05-12 NOTE — Progress Notes (Signed)
Subjective:    I'm seeing this patient as a consultation for:  Hali Marry, MD   CC: Left 4th digit pain  HPI: David Cole notes pain and stiffness at his left fourth digit DIP joint.  He notes stiffness pain and a palpable nodule at the radial side of the joint that when pressed causes numbness to the distal tip of his finger.  He denies any injury and notes the symptoms have been ongoing now for about a month.  He is a left-handed competitive bowler and notes that his pain has certainly been interfering with his ability to bowl.  Bowling does seem to exacerbate his pain as well.  He is also having pain at work and having symptoms and irritability and pain with typing.  He is tried over-the-counter medications and some braces which have only helped a little.  He was seen by his primary care provider about a week ago who obtained an x-ray showing DJD at the DIP joint.  He is here today for follow-up.  Past medical history, Surgical history, Family history not pertinant except as noted below, Social history, Allergies, and medications have been entered into the medical record, reviewed, and no changes needed.   Review of Systems: No headache, visual changes, nausea, vomiting, diarrhea, constipation, dizziness, abdominal pain, skin rash, fevers, chills, night sweats, weight loss, swollen lymph nodes, body aches, joint swelling, muscle aches, chest pain, shortness of breath, mood changes, visual or auditory hallucinations.   Objective:    Vitals:   05/12/17 0743  BP: 126/77  Pulse: 77   General: Well Developed, well nourished, and in no acute distress.  Neuro/Psych: Alert and oriented x3, extra-ocular muscles intact, able to move all 4 extremities, sensation grossly intact. Skin: Warm and dry, no rashes noted.  Respiratory: Not using accessory muscles, speaking in full sentences, trachea midline.  Cardiovascular: Pulses palpable, no extremity edema. Abdomen: Does not appear  distended. MSK: Left hand fourth DIP joint is normal-appearing with no swelling or erythema. He has significant lack of range of motion of the joint.  He has normal MCP motion of the fourth digit, and slightly limited flexion of the PIP joint of the fourth digit.  However he does have intact flexion and extension strength of the DIP. There is a tiny 1-2 mm nodule palpated at the radial aspect of the joint that when pressed is mobile and does reproduce some of his tingling and pain to the distal finger. Capillary refill and sensation are intact  Limited musculoskeletal ultrasound of the left fourth DIP reveals a degenerative appearing joint with no effusion.  The flexor tendon is intact at the insertion at the distal phalanx. There is no cystic nodule visible on the radial joint however the digital nerve appears larger in this area approximately 1-2 mm in diameter.  The quality of the ultrasound scan is limited by the resolution of the probe.   Procedure: Real-time Ultrasound Guided Injection of left fourth digit DIP joint and neuroma Device: GE Logiq E   Images permanently stored and available for review in the ultrasound unit. Verbal informed consent obtained.  Discussed risks and benefits of procedure. Warned about infection bleeding damage to structures skin hypopigmentation and fat atrophy among others. Patient expresses understanding and agreement Time-out conducted.   Noted no overlying erythema, induration, or other signs of local infection.   Skin prepped in a sterile fashion.   Local anesthesia: Topical Ethyl chloride.   With sterile technique and under real time ultrasound  guidance:  The needle was inserted into the skin and 2.5 mg of dexamethasone and 0.25 mL of lidocaine were injected around the neuroma at the digital nerve at the radial side of the joint.  The needle was removed and the skin was cleaned again with alcohol and chlorhexidine.  The needle was then reinserted and 2.5 mg  of dexamethasone and 0.25 mL of lidocaine were injected into the DIP joint.  Care was taken to avoid direct injection of the flexor tendon.  Completed without difficulty   Pain immediately resolved suggesting accurate placement of the medication.   Advised to call if fevers/chills, erythema, induration, drainage, or persistent bleeding.   Images permanently stored and available for review in the ultrasound unit.  Impression: Technically successful ultrasound guided injection.       EXAM: LEFT RING FINGER 2+V  COMPARISON:  None.  FINDINGS: No acute bony or joint abnormality is identified. The patient has osteoarthritis about the DIP joint with fairly prominent osteophytosis and some joint space narrowing. Degenerative disease about the DIP joint of the long finger is partially imaged. Imaged joints otherwise appear normal. Soft tissues are unremarkable.  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  Osteoarthritis about the DIP joint of the left long finger is partially imaged.  No acute finding.   Electronically Signed   By: Inge Rise M.D.   On: 05/06/2017 16:15    Impression and Recommendations:    Assessment and Plan: 47 y.o. left hand dominant male with  Stiffness pain and palpable paresthesias at the left fourth DIP.  I believe the tubes are multifactorial.  I believe the palpable nodule is very likely a neuroma.  We have treated this with injection surrounding the neuroma.  Will also provide some padding and recheck in about a month  The stiffness and pain I believe is DJD.  No evidence of [place] finger on exam and ultrasound.  Joint injection today.  Additionally will proceed with trial of diclofenac gel and padding.  If not better next step would be in physical therapy.  Recheck in about 4 weeks.  Advised patient to avoid heavy duty had activity for about a week or 2 following injection.   No orders of the defined  types were placed in this encounter.  Meds ordered this encounter  Medications  . diclofenac sodium (VOLTAREN) 1 % GEL    Sig: Apply 2 g topically 4 (four) times daily. To affected joint.    Dispense:  100 g    Refill:  11    Discussed warning signs or symptoms. Please see discharge instructions. Patient expresses understanding.

## 2017-05-13 ENCOUNTER — Telehealth: Payer: Self-pay | Admitting: Family Medicine

## 2017-05-13 NOTE — Telephone Encounter (Signed)
Received fax from [employer] that Testosterone requires a PA. Information has been sent to the insurance company. Awaiting determination.   

## 2017-05-27 ENCOUNTER — Other Ambulatory Visit: Payer: Self-pay | Admitting: Family Medicine

## 2017-05-31 NOTE — Telephone Encounter (Signed)
Spoke with Gregary Signs at [facility] and she stated that more clinical information was needed. She needed to verify that two separate labs were drawn in the am and per the note they were.

## 2017-06-08 DIAGNOSIS — G4733 Obstructive sleep apnea (adult) (pediatric): Secondary | ICD-10-CM | POA: Diagnosis not present

## 2017-06-09 ENCOUNTER — Encounter: Payer: Self-pay | Admitting: Family Medicine

## 2017-06-09 ENCOUNTER — Ambulatory Visit: Payer: BLUE CROSS/BLUE SHIELD | Admitting: Family Medicine

## 2017-06-09 VITALS — BP 126/80 | HR 81 | Wt 275.1 lb

## 2017-06-09 DIAGNOSIS — M25641 Stiffness of right hand, not elsewhere classified: Secondary | ICD-10-CM

## 2017-06-09 NOTE — Progress Notes (Signed)
David Cole is a 47 y.o. male who presents to [facility] today for follow-up finger stiffness and pain.  David Cole was seen about a month ago for right fourth digit DIP stiffness associated with tingling pain into the fingertip.  He had an x-ray that showed moderate DJD at the DIP and ultrasound that was concerning for a digital neuroma.  He had a combination intra-articular DIP injection and hydrodissection of the neuroma with ultrasound guidance on March 13.  He notes this provided fantastic symptom control for about 2 weeks.  He notes the irritation and tingling pain has returned and he continues to experience significant stiffness in his finger.  He notes the stiffness never really got better with the shot.  His symptoms are bothersome at work when he types and interfere with his ability to bowl which he likes to do recreationally at least once a week.   Past Medical History:  Diagnosis Date  . Hypertension    No past surgical history on file. Social History   Tobacco Use  . Smoking status: Never Smoker  . Smokeless tobacco: Never Used  Substance Use Topics  . Alcohol use: Yes    Alcohol/week: 0.0 oz    Types: 1 - 3 Standard drinks or equivalent per week    Comment: per week   family history includes Alcohol abuse in his unknown relative.  ROS as above:  Medications: Current Outpatient Medications  Medication Sig Dispense Refill  . AMBULATORY NON FORMULARY MEDICATION Medication Name:  Please get download of CPAP.  We changed his pressure to 12 some time ago.  Marland Kitchen  Aeroflow Fax to [phone]  Dx OSA. 1 Units PRN  . AMBULATORY NON FORMULARY MEDICATION Medication Name: Set CPAP to Autopap for 2 weeks and then fax download so we can see if need to adjust pressure.  I think he uses Aeroflow. 1 vial 0  . atorvastatin (LIPITOR) 20 MG tablet Take 1 tablet (20 mg  total) by mouth at bedtime. 90 tablet 3  . diclofenac sodium (VOLTAREN) 1 % GEL Apply 2 g topically 4 (four) times daily. To affected joint. 100 g 11  . glucose blood (FREESTYLE TEST STRIPS) test strip USE TO CHECK FASTING BLOOD GLUCOSE DAILY 100 each 11  . glucose monitoring kit (FREESTYLE) monitoring kit 1 each by Does not apply route as needed for other. Dx Diabetes type 2. 1 each PRN  . Lancets (FREESTYLE) lancets USE TO CHECK FASTING BLOOD GLUCOSE DAILY 100 each 11  . metoprolol succinate (TOPROL-XL) 100 MG 24 hr tablet TAKE 1 TABLET DAILY WITH OR IMMEDIATELY FOLLOWING A MEAL 90 tablet 1  . Olmesartan-Amlodipine-HCTZ 40-5-25 MG TABS Take 1 tablet by mouth daily. 90 tablet 1  . sitaGLIPtin-metformin (JANUMET) 50-1000 MG tablet Take 1 tablet by mouth 2 (two) times daily with a meal. 180 tablet 1  . Testosterone 10 MG/ACT (2%) GEL 1 pump to each inner thigh each morning. 60 g 5   No current facility-administered medications for this visit.    No Known Allergies  Health Maintenance Health Maintenance  Topic Date Due  . INFLUENZA VACCINE  01/01/2018 (Originally 09/30/2017)  . OPHTHALMOLOGY EXAM  01/01/2018 (Originally 08/18/1980)  . PNEUMOCOCCAL POLYSACCHARIDE VACCINE (1) 06/10/2018 (Originally 08/18/1972)  . FOOT EXAM  06/16/2017  . HEMOGLOBIN A1C  07/01/2017  . TETANUS/TDAP  01/23/2025  . HIV Screening  Completed     Exam:  BP 126/80   Pulse 81  Wt 275 lb 1.3 oz (124.8 kg)   BMI 40.62 kg/m  Gen: Well NAD Right hand fourth DIP no significant swelling. Significantly stiff to flexion active and passive.  Patient does have intact flexion and extension strength. Palpable nodule present at the radial side of the digit reproducing the numbness and tingling distally.  No results found for this or any previous visit (from the past 72 hour(s)). No results found.    Assessment and Plan: 47 y.o. male with  Right hand fourth digit DIP DJD and stiffness.  Had a trial of injection which  did not provide lasting benefit.  Additionally he is using diclofenac gel.  At this point is reasonable to proceed with hand therapy.  Recheck in about 6 weeks if not better would repeat injection at that time.  Neuroma: Reasonably controlled.  Trial of hand therapy and repeat injection in the future if not better.  Patient declined pneumonia vaccine today.   Orders Placed This Encounter  Procedures  . Ambulatory referral to Occupational Therapy    Referral Priority:   Routine    Referral Type:   Occupational Therapy    Referral Reason:   Specialty Services Required    Requested Specialty:   Occupational Therapy    Number of Visits Requested:   1   No orders of the defined types were placed in this encounter.    Discussed warning signs or symptoms. Please see discharge instructions. Patient expresses understanding.

## 2017-06-09 NOTE — Patient Instructions (Signed)
Thank you for coming in today. Attend hand therapy.  Let me know how you are doing in 6 weeks or so.  Next step after 6 weeks would be repeat injection and then if not better hand surgery referral.

## 2017-06-10 NOTE — Telephone Encounter (Signed)
Received a denial letter from [employer] [facility] that Testosterone is not covered since it did not meet medical necessity. I have sent all the information regarding labs being drawn in the morning and the office note. Please advise.

## 2017-06-10 NOTE — Telephone Encounter (Signed)
Please call patient and let him know that we have attempted several times to get it covered any are just not wanting to pay for it.  At this point the only other option would be to have testosterone injections done in the office.  But he would need to probably call his insurance and just verify that it would be covered in the office versus a prescription topical.

## 2017-06-11 NOTE — Telephone Encounter (Signed)
Pt advised, he will contact his insurance company prior to scheduling.

## 2017-06-29 NOTE — Telephone Encounter (Signed)
Received fax from [facility] that Testosterone was approved from 05/31/2017 through 06/29/2018.  Pharmacy notified and forms sent to scan.   Reference ID: [id number].

## 2017-07-01 ENCOUNTER — Encounter: Payer: Self-pay | Admitting: Family Medicine

## 2017-07-01 ENCOUNTER — Ambulatory Visit: Payer: BLUE CROSS/BLUE SHIELD | Admitting: Family Medicine

## 2017-07-01 VITALS — BP 126/61 | HR 78 | Ht 69.0 in | Wt 275.0 lb

## 2017-07-01 DIAGNOSIS — I10 Essential (primary) hypertension: Secondary | ICD-10-CM | POA: Diagnosis not present

## 2017-07-01 DIAGNOSIS — G4733 Obstructive sleep apnea (adult) (pediatric): Secondary | ICD-10-CM | POA: Diagnosis not present

## 2017-07-01 DIAGNOSIS — Z9989 Dependence on other enabling machines and devices: Secondary | ICD-10-CM | POA: Diagnosis not present

## 2017-07-01 DIAGNOSIS — R7989 Other specified abnormal findings of blood chemistry: Secondary | ICD-10-CM

## 2017-07-01 DIAGNOSIS — E119 Type 2 diabetes mellitus without complications: Secondary | ICD-10-CM | POA: Diagnosis not present

## 2017-07-01 LAB — POCT GLYCOSYLATED HEMOGLOBIN (HGB A1C): Hemoglobin A1C: 5.6

## 2017-07-01 NOTE — Progress Notes (Signed)
Subjective:    CC: Diabetes, blood pressure  HPI: Hypertension- Pt denies chest pain, SOB, dizziness, or heart palpitations.  Taking meds as directed w/o problems.  Denies medication side effects.    Diabetes -last follow-up was about 5 months ago.  No hypoglycemic events. No wounds or sores that are not healing well. No increased thirst or urination. Checking glucose at home. Taking medications as prescribed without any side effects.  He he has been doing green juice for breakfast and lunch 3 days/week.  He is been trying to eat better overall and he started walking 2 miles a day.  Follow-up obstructive sleep apnea-he is getting a little better adjusted to his CPAP and doing well with that overall.  Still experiencing some leaks.  Hypogonadism-his testosterone was recently approved.  Past medical history, Surgical history, Family history not pertinant except as noted below, Social history, Allergies, and medications have been entered into the medical record, reviewed, and corrections made.   Review of Systems: No fevers, chills, night sweats, weight loss, chest pain, or shortness of breath.   Objective:    General: Well Developed, well nourished, and in no acute distress.  Neuro: Alert and oriented x3, extra-ocular muscles intact, sensation grossly intact.  HEENT: Normocephalic, atraumatic  Skin: Warm and dry, no rashes. Cardiac: Regular rate and rhythm, no murmurs rubs or gallops, no lower extremity edema.  Respiratory: Clear to auscultation bilaterally. Not using accessory muscles, speaking in full sentences.   Impression and Recommendations:    DM -well controlled.  Hemoglobin A1c 5.6 today.  I will see him back in 3 months and if we are able to keep A1c at this therapeutic level then plan to discontinue the Januvia and continue metformin by itself.  HTN - Well controlled. Continue current regimen. Follow up in  39months.    OSA -   Doing well.    Hypogonadism-plan to recheck  testosterone level, PSA and CBC in about 2 to 3 months.

## 2017-08-06 ENCOUNTER — Ambulatory Visit: Payer: BLUE CROSS/BLUE SHIELD | Admitting: Family Medicine

## 2017-08-29 ENCOUNTER — Other Ambulatory Visit: Payer: Self-pay | Admitting: Family Medicine

## 2017-08-31 DIAGNOSIS — G4733 Obstructive sleep apnea (adult) (pediatric): Secondary | ICD-10-CM | POA: Diagnosis not present

## 2017-09-14 ENCOUNTER — Other Ambulatory Visit: Payer: Self-pay | Admitting: Family Medicine

## 2017-09-14 DIAGNOSIS — E119 Type 2 diabetes mellitus without complications: Secondary | ICD-10-CM

## 2017-10-01 ENCOUNTER — Emergency Department
Admission: EM | Admit: 2017-10-01 | Discharge: 2017-10-01 | Disposition: A | Discharge status: Home / Self Care | Payer: BLUE CROSS/BLUE SHIELD | Source: Home / Self Care | Attending: Family Medicine | Admitting: Family Medicine

## 2017-10-01 ENCOUNTER — Other Ambulatory Visit: Payer: Self-pay

## 2017-10-01 ENCOUNTER — Ambulatory Visit: Payer: BLUE CROSS/BLUE SHIELD | Admitting: Physician Assistant

## 2017-10-01 ENCOUNTER — Encounter: Payer: Self-pay | Admitting: Emergency Medicine

## 2017-10-01 DIAGNOSIS — L237 Allergic contact dermatitis due to plants, except food: Secondary | ICD-10-CM

## 2017-10-01 MED ORDER — TRIAMCINOLONE ACETONIDE 0.1 % EX CREA: 1.0000 "application " | TOPICAL_CREAM | Freq: Two times a day (BID) | CUTANEOUS | 0 refills | Status: DC

## 2017-10-01 MED ORDER — PREDNISONE 20 MG PO TABS: 40.0000 mg | ORAL_TABLET | Freq: Every day | ORAL | 0 refills | Status: AC

## 2017-10-01 MED ORDER — METHYLPREDNISOLONE ACETATE 80 MG/ML IJ SUSP
80.0000 mg | Freq: Once | INTRAMUSCULAR | Status: AC
Start: 2017-10-01 — End: 2017-10-01
  Administered 2017-10-01: 80 mg via INTRAMUSCULAR

## 2017-10-01 NOTE — ED Triage Notes (Signed)
Red itchy rash on arms, legs x 1 week

## 2017-10-01 NOTE — Discharge Instructions (Signed)
°  You were given a shot of depo-medrol (a steroid) today to help with itching and swelling from a likely allergic reaction.  You have been prescribed 4 days of prednisone, an oral steroid.  You may start this medication tomorrow with breakfast.    Please follow up with your family doctor next week if not improving.

## 2017-10-01 NOTE — ED Provider Notes (Signed)
[facility]    CSN: [id number] Arrival date & time: 10/01/17  1258     History   Chief Complaint Chief Complaint  Patient presents with  . Rash    HPI David Cole is a 47 y.o. male.   HPI  David Cole is a 47 y.o. male presenting to UC with c/o red itching rash to arms, abdomen, and legs for about 1 week. He was working in the yard about 2 weeks ago and was pulling vines and he was also playing with his dogs. He has never had poison ivy rash but he believes this may be what he has.  He has tried OTC benadryl and cortisone with mild temporary relief. Minimal soreness but moderate itching to the rash with small amount of yellow crusting drainage. No fever or chills. No new soaps, lotions, or medications.    Past Medical History:  Diagnosis Date  . Hypertension     Patient Active Problem List   Diagnosis Date Noted  . Neuroma digital nerve, left 05/12/2017  . Arthritis of finger 05/12/2017  . Type 2 diabetes mellitus without complication, without long-term current use of insulin ([place]) 05/04/2016  . Low testosterone 05/04/2016  . OSA on CPAP 11/15/2014  . Hyperlipidemia 10/13/2010  . Essential hypertension, benign 09/15/2010    History reviewed. No pertinent surgical history.     Home Medications    Prior to Admission medications   Medication Sig Start Date End Date Taking? Authorizing Provider  AMBULATORY NON FORMULARY MEDICATION Medication Name: Set CPAP to Autopap for 2 weeks and then fax download so we can see if need to adjust pressure.  I think he uses Aeroflow. 01/01/17   Hali Marry, MD  atorvastatin (LIPITOR) 20 MG tablet Take 1 tablet (20 mg total) by mouth at bedtime. 05/11/17   Hali Marry, MD  diclofenac sodium (VOLTAREN) 1 % GEL Apply 2 g topically 4 (four) times daily. To affected joint. 05/12/17   Gregor Hams, MD  glucose blood (FREESTYLE TEST STRIPS) test strip USE TO CHECK FASTING BLOOD GLUCOSE DAILY 05/11/17   Hali Marry, MD  glucose monitoring kit (FREESTYLE) monitoring kit 1 each by Does not apply route as needed for other. Dx Diabetes type 2. 03/18/16   Hali Marry, MD  JANUMET 50-1000 MG tablet TAKE 1 TABLET TWICE A DAY WITH MEALS 09/14/17   Hali Marry, MD  Lancets (FREESTYLE) lancets USE TO CHECK FASTING BLOOD GLUCOSE DAILY 05/11/17   Hali Marry, MD  metoprolol succinate (TOPROL-XL) 100 MG 24 hr tablet TAKE 1 TABLET DAILY WITH OR IMMEDIATELY FOLLOWING A MEAL 05/27/17   Hali Marry, MD  Olmesartan-amLODIPine-HCTZ 40-5-25 MG TABS TAKE 1 TABLET DAILY 08/30/17   Hali Marry, MD  predniSONE (DELTASONE) 20 MG tablet Take 2 tablets (40 mg total) by mouth daily with breakfast for 4 days. 10/01/17 10/05/17  Noe Gens, PA-C  Testosterone 10 MG/ACT (2%) GEL 1 pump to each inner thigh each morning. 05/11/17   Hali Marry, MD  triamcinolone cream (KENALOG) 0.1 % Apply 1 application topically 2 (two) times daily. 10/01/17   Noe Gens, PA-C    Family History Family History  Problem Relation Age of Onset  . Alcohol abuse Unknown        GF    Social History Social History   Tobacco Use  . Smoking status: Never Smoker  . Smokeless tobacco: Never Used  Substance Use Topics  . Alcohol  use: Yes    Alcohol/week: 0.6 - 1.8 oz    Types: 1 - 3 Standard drinks or equivalent per week    Comment: per week  . Drug use: No     Allergies   Patient has no known allergies.   Review of Systems Review of Systems  Constitutional: Negative for chills and fever.  Musculoskeletal: Negative for arthralgias, joint swelling and myalgias.  Skin: Positive for color change and rash.     Physical Exam Triage Vital Signs ED Triage Vitals  Enc Vitals Group     BP 10/01/17 1340 137/84     Pulse Rate 10/01/17 1340 78     Resp --      Temp 10/01/17 1340 98.4 F (36.9 C)     Temp Source 10/01/17 1340 Oral     SpO2 10/01/17 1340 96 %     Weight 10/01/17  1344 276 lb (125.2 kg)     Height 10/01/17 1344 '5\' 10"'  (1.778 m)     Head Circumference --      Peak Flow --      Pain Score 10/01/17 1342 0     Pain Loc --      Pain Edu? --      Excl. in [place]? --    No data found.  Updated Vital Signs BP 137/84 (BP Location: Right Arm)   Pulse 78   Temp 98.4 F (36.9 C) (Oral)   Ht '5\' 10"'  (1.778 m)   Wt 276 lb (125.2 kg)   SpO2 96%   BMI 39.60 kg/m   Visual Acuity Right Eye Distance:   Left Eye Distance:   Bilateral Distance:    Right Eye Near:   Left Eye Near:    Bilateral Near:     Physical Exam  Constitutional: He is oriented to person, place, and time. He appears well-developed and well-nourished. No distress.  HENT:  Head: Normocephalic and atraumatic.  Eyes: EOM are normal.  Neck: Normal range of motion.  Cardiovascular: Normal rate.  Pulmonary/Chest: Effort normal.  Musculoskeletal: Normal range of motion. He exhibits no edema or tenderness.  Neurological: He is alert and oriented to person, place, and time.  Skin: Skin is warm and dry. He is not diaphoretic. There is erythema.  Diffuse erythematous maculopapular rash on forearms, one 2-3cm lesion on left lower abdomen, and multiple rashes on his lower legs. Non-tender. Scant yellow discharge in some areas of the rash.   Psychiatric: He has a normal mood and affect. His behavior is normal.  Nursing note and vitals reviewed.    UC Treatments / Results  Labs (all labs ordered are listed, but only abnormal results are displayed) Labs Reviewed - No data to display  EKG None  Radiology No results found.  Procedures Procedures (including critical care time)  Medications Ordered in UC Medications  methylPREDNISolone acetate (DEPO-MEDROL) injection 80 mg (has no administration in time range)    Initial Impression / Assessment and Plan / UC Course  I have reviewed the triage vital signs and the nursing notes.  Pertinent labs & imaging results that were available  during my care of the patient were reviewed by me and considered in my medical decision making (see chart for details).     Rash c/w contact dermatitis w/o evidence of underlying infection. Discussed steroids treatment. Encouraged to monitor his blood sugar. Home care instructions provided below.    Final Clinical Impressions(s) / UC Diagnoses   Final diagnoses:  Allergic contact dermatitis  due to plants, except food     Discharge Instructions      You were given a shot of depo-medrol (a steroid) today to help with itching and swelling from a likely allergic reaction.  You have been prescribed 4 days of prednisone, an oral steroid.  You may start this medication tomorrow with breakfast.    Please follow up with your family doctor next week if not improving.      ED Prescriptions    Medication Sig Dispense Auth. Provider   triamcinolone cream (KENALOG) 0.1 % Apply 1 application topically 2 (two) times daily. 30 g Gerarda Fraction, [place] Castello O, PA-C   predniSONE (DELTASONE) 20 MG tablet Take 2 tablets (40 mg total) by mouth daily with breakfast for 4 days. 8 tablet Noe Gens, PA-C     Controlled Substance Prescriptions [place] Controlled Substance Registry consulted? Not Applicable   Tyrell Antonio 10/01/17 1414

## 2017-10-04 ENCOUNTER — Telehealth: Payer: Self-pay

## 2017-10-04 MED ORDER — TRIAMCINOLONE ACETONIDE 0.1 % EX CREA: 1.0000 "application " | TOPICAL_CREAM | Freq: Two times a day (BID) | CUTANEOUS | 1 refills | Status: DC

## 2017-10-04 NOTE — Telephone Encounter (Signed)
Pt advised. He had questions about billing his visit with urgent care- Voicemail sent to [place] to review.

## 2017-10-04 NOTE — Telephone Encounter (Signed)
K, no problem.  New prescription sent for 45 g.

## 2017-10-04 NOTE — Telephone Encounter (Signed)
Pt called- was evaluated at urgent care for contact dermatitis. Wants to know if Dr Madilyn Fireman is ok to refill his Triamcinolone cream since he has such a large area to cover and he was only given a 30 gram tube. Reports cream is working well but working slowly.  Please advise

## 2017-10-12 ENCOUNTER — Ambulatory Visit: Payer: BLUE CROSS/BLUE SHIELD | Admitting: Family Medicine

## 2017-10-12 ENCOUNTER — Telehealth: Payer: Self-pay | Admitting: *Deleted

## 2017-10-12 ENCOUNTER — Encounter: Payer: Self-pay | Admitting: Family Medicine

## 2017-10-12 ENCOUNTER — Telehealth: Payer: Self-pay | Admitting: Family Medicine

## 2017-10-12 VITALS — BP 132/70 | HR 77 | Ht 69.0 in | Wt 269.0 lb

## 2017-10-12 DIAGNOSIS — E119 Type 2 diabetes mellitus without complications: Secondary | ICD-10-CM

## 2017-10-12 DIAGNOSIS — Z125 Encounter for screening for malignant neoplasm of prostate: Secondary | ICD-10-CM

## 2017-10-12 DIAGNOSIS — I10 Essential (primary) hypertension: Secondary | ICD-10-CM | POA: Diagnosis not present

## 2017-10-12 DIAGNOSIS — E291 Testicular hypofunction: Secondary | ICD-10-CM | POA: Diagnosis not present

## 2017-10-12 LAB — POCT GLYCOSYLATED HEMOGLOBIN (HGB A1C): Hemoglobin A1C: 6.1 % — AB (ref 4.0–5.6)

## 2017-10-12 MED ORDER — PREDNISONE 20 MG PO TABS: ORAL_TABLET | ORAL | 0 refills | Status: AC

## 2017-10-12 MED ORDER — SILDENAFIL CITRATE 100 MG PO TABS: 50.0000 mg | ORAL_TABLET | Freq: Every day | ORAL | 99 refills | Status: DC | PRN

## 2017-10-12 NOTE — Telephone Encounter (Signed)
Please disregard the previous message. See note below:  It was approved  [id number];UEKCMK:[id number];Review Type:Prior Auth;Coverage Start Date:09/12/2017;Coverage End Date:10/12/2018;;CaseId:[id number];Status:Denied;Review Type:Qty;Appeal Information: Attention:ATTN: [place] [id number]. AVWPV,XY,[id number] MOLMB:[phone] Fax:[phone];-hsm

## 2017-10-12 NOTE — Telephone Encounter (Signed)
Error

## 2017-10-12 NOTE — Progress Notes (Signed)
Subjective:    CC: DM, BP  HPI:  Diabetes - no hypoglycemic events. No wounds or sores that are not healing well. No increased thirst or urination. Checking glucose at home. Taking medications as prescribed without any side effects.  Hypertension- Pt denies chest pain, SOB, dizziness, or heart palpitations.  Taking meds as directed w/o problems.  Denies medication side effects.    Also seen in urgent care on August 2 for a contact dermatitis likely from a plantar poison ivy.  He was given triamcinolone cream as well as some oral prednisone to take for 4 days.  He still has some active areas.    Follow-up hypogonadism-he actually decided to stop the testosterone.  He says he really was not noticing any benefit with energy levels or with erectile dysfunction that he been experiencing.  That he would like to discuss options for the ED specifically.  Past medical history, Surgical history, Family history not pertinant except as noted below, Social history, Allergies, and medications have been entered into the medical record, reviewed, and corrections made.   Review of Systems: No fevers, chills, night sweats, weight loss, chest pain, or shortness of breath.   Objective:    General: Well Developed, well nourished, and in no acute distress.  Neuro: Alert and oriented x3, extra-ocular muscles intact, sensation grossly intact.  HEENT: Normocephalic, atraumatic  Skin: Warm and dry, no rashes. Cardiac: Regular rate and rhythm, no murmurs rubs or gallops, no lower extremity edema.  Respiratory: Clear to auscultation bilaterally. Not using accessory muscles, speaking in full sentences.   Impression and Recommendations:    DM - plans on getting eye exam. Well controlled. Continue current regimen. Follow up in  4 mo.   HTN - Well controlled. Continue current regimen. Follow up in  4 months.   Hypogonadism -off of testosterone.  Not wanting to continue with testosterone therapy.  ED -Discussed  options.  He would like to try an ED medication.  Prescription sent to pharmacy for Viagra.  If not covered by his insurance and we can switch to generic 20 mg sildenafil instead.  Plain that he would have to take multiple tabs to be equal.

## 2017-10-12 NOTE — Telephone Encounter (Signed)
Received a fax from [facility] that Sildenafil was not covered for this patient with the diagnosis provided. Please advise.

## 2017-10-13 ENCOUNTER — Other Ambulatory Visit: Payer: Self-pay

## 2017-10-13 NOTE — Telephone Encounter (Signed)
[facility] requested a refill on testosterone. This is not a current medication. Please advise.

## 2017-10-14 MED ORDER — TESTOSTERONE 10 MG/ACT (2%) TD GEL
TRANSDERMAL | 3 refills | Status: DC
Start: 2017-10-14 — End: 2017-10-25

## 2017-10-25 ENCOUNTER — Other Ambulatory Visit: Payer: Self-pay | Admitting: *Deleted

## 2017-11-23 ENCOUNTER — Other Ambulatory Visit: Payer: Self-pay | Admitting: Family Medicine

## 2017-12-02 DIAGNOSIS — G4733 Obstructive sleep apnea (adult) (pediatric): Secondary | ICD-10-CM | POA: Diagnosis not present

## 2018-02-11 ENCOUNTER — Ambulatory Visit: Payer: BLUE CROSS/BLUE SHIELD | Admitting: Family Medicine

## 2018-02-14 ENCOUNTER — Encounter: Payer: Self-pay | Admitting: Family Medicine

## 2018-02-14 ENCOUNTER — Ambulatory Visit: Payer: BLUE CROSS/BLUE SHIELD | Admitting: Family Medicine

## 2018-02-14 VITALS — BP 128/68 | HR 98 | Ht 69.0 in | Wt 282.0 lb

## 2018-02-14 DIAGNOSIS — E119 Type 2 diabetes mellitus without complications: Secondary | ICD-10-CM | POA: Diagnosis not present

## 2018-02-14 DIAGNOSIS — I10 Essential (primary) hypertension: Secondary | ICD-10-CM | POA: Diagnosis not present

## 2018-02-14 DIAGNOSIS — J069 Acute upper respiratory infection, unspecified: Secondary | ICD-10-CM

## 2018-02-14 LAB — POCT GLYCOSYLATED HEMOGLOBIN (HGB A1C): HEMOGLOBIN A1C: 6 % — AB (ref 4.0–5.6)

## 2018-02-14 NOTE — Progress Notes (Signed)
Subjective:    CC: DM f/u  HPI:  Diabetes - no hypoglycemic events. No wounds or sores that are not healing well. No increased thirst or urination. Checking glucose at home. Taking medications as prescribed without any side effects.  He also reports that he is had cold symptoms for about 5 days.  He has been getting some green nasal  mucus.  Congestion and cough.  No fevers chills or sweats.  Using some Mucinex nighttime and that does seem to help.  His hemorrhoids were flared last week and he did notice some blood.  Throat has been sore.  He also wanted to let me know that Tuesday or Wednesday of last week he actually swallowed a sharp object.  He is not exactly sure what it was.  But he does think he passed it through his bowels.  He has been using his CPAP but says he has not been cleaning it regularly he just was not sure the best way to do this.  He looked into some of the sanitizing machines but they are around $300.  BP 128/68   Pulse 98   Ht _0  (1.753 m)   Wt 282 lb (127.9 kg)   SpO2 95%   BMI 41.64 kg/m     No Known Allergies  Past Medical History:  Diagnosis Date  . Hypertension     History reviewed. No pertinent surgical history.  Social History   Socioeconomic History  . Marital status: Married    Spouse name: [name]  . Number of children: 1  . Years of education: Not on file  . Highest education level: Not on file  Occupational History  . Occupation: Tax inspector: [employer]  Social Needs  . Financial resource strain: Not on file  . Food insecurity:    Worry: Not on file    Inability: Not on file  . Transportation needs:    Medical: Not on file    Non-medical: Not on file  Tobacco Use  . Smoking status: Never Smoker  . Smokeless tobacco: Never Used  Substance and Sexual Activity  . Alcohol use: Yes    Alcohol/week: 1.0 - 3.0 standard drinks    Types: 1 - 3 Standard drinks or equivalent per week    Comment: per week   . Drug use: No  . Sexual activity: Not on file  Lifestyle  . Physical activity:    Days per week: Not on file    Minutes per session: Not on file  . Stress: Not on file  Relationships  . Social connections:    Talks on phone: Not on file    Gets together: Not on file    Attends religious service: Not on file    Active member of club or organization: Not on file    Attends meetings of clubs or organizations: Not on file    Relationship status: Not on file  . Intimate partner violence:    Fear of current or ex partner: Not on file    Emotionally abused: Not on file    Physically abused: Not on file    Forced sexual activity: Not on file  Other Topics Concern  . Not on file  Social History Narrative   1 caffeine drink per day. Some college     Family History  Problem Relation Age of Onset  . Alcohol abuse Unknown        GF    Outpatient Encounter  Medications as of 02/14/2018  Medication Sig  . AMBULATORY NON FORMULARY MEDICATION Medication Name: Set CPAP to Autopap for 2 weeks and then fax download so we can see if need to adjust pressure.  I think he uses Aeroflow.  Marland Kitchen atorvastatin (LIPITOR) 20 MG tablet Take 1 tablet (20 mg total) by mouth at bedtime.  . diclofenac sodium (VOLTAREN) 1 % GEL Apply 2 g topically 4 (four) times daily. To affected joint.  Marland Kitchen glucose blood (FREESTYLE TEST STRIPS) test strip USE TO CHECK FASTING BLOOD GLUCOSE DAILY  . glucose monitoring kit (FREESTYLE) monitoring kit 1 each by Does not apply route as needed for other. Dx Diabetes type 2.  Marland Kitchen JANUMET 50-1000 MG tablet TAKE 1 TABLET TWICE A DAY WITH MEALS  . Lancets (FREESTYLE) lancets USE TO CHECK FASTING BLOOD GLUCOSE DAILY  . metoprolol succinate (TOPROL-XL) 100 MG 24 hr tablet TAKE 1 TABLET DAILY WITH OR IMMEDIATELY FOLLOWING A MEAL  . Olmesartan-amLODIPine-HCTZ 40-5-25 MG TABS TAKE 1 TABLET DAILY  . sildenafil (VIAGRA) 100 MG tablet Take 0.5-1 tablets (50-100 mg total) by mouth daily as needed  for erectile dysfunction.  . triamcinolone cream (KENALOG) 0.1 % Apply 1 application topically 2 (two) times daily.   No facility-administered encounter medications on file as of 02/14/2018.      .   Objective:    General: Well Developed, well nourished, and in no acute distress.  Neuro: Alert and oriented x3, extra-ocular muscles intact, sensation grossly intact.  HEENT: Normocephalic, atraumatic oropharynx is clear, TMs and canals are clear bilaterally.  No significant cervical lymphadenopathy. Skin: Warm and dry, no rashes. Cardiac: Regular rate and rhythm, no murmurs rubs or gallops, no lower extremity edema.  Respiratory: Clear to auscultation bilaterally. Not using accessory muscles, speaking in full sentences.   Impression and Recommendations:    DM-Well controlled.  Hemoglobin A1c is 6.0 today.  HTN - Well controlled. Continue current regimen. Follow up in  4 months.    URI -reassurance.  Likely viral.  Okay to continue symptomatic care.

## 2018-02-15 ENCOUNTER — Encounter: Payer: Self-pay | Admitting: Family Medicine

## 2018-02-26 ENCOUNTER — Other Ambulatory Visit: Payer: Self-pay | Admitting: Family Medicine

## 2018-03-13 ENCOUNTER — Other Ambulatory Visit: Payer: Self-pay | Admitting: Family Medicine

## 2018-03-13 DIAGNOSIS — E119 Type 2 diabetes mellitus without complications: Secondary | ICD-10-CM

## 2018-03-17 DIAGNOSIS — G4733 Obstructive sleep apnea (adult) (pediatric): Secondary | ICD-10-CM | POA: Diagnosis not present

## 2018-04-18 ENCOUNTER — Other Ambulatory Visit: Payer: Self-pay | Admitting: Family Medicine

## 2018-05-06 ENCOUNTER — Other Ambulatory Visit: Payer: Self-pay | Admitting: Family Medicine

## 2018-05-06 DIAGNOSIS — E119 Type 2 diabetes mellitus without complications: Secondary | ICD-10-CM

## 2018-05-12 ENCOUNTER — Telehealth: Payer: Self-pay

## 2018-05-12 NOTE — Telephone Encounter (Signed)
OK for note stating that he is at higher risk if he contracts.   Coronavirus

## 2018-05-12 NOTE — Telephone Encounter (Signed)
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 He wanted to know if he could get a letter recommending he go ahead and start working from home due to the fact he has diabetes. Please advise.

## 2018-05-13 NOTE — Telephone Encounter (Signed)
Patient advised and letter left up front for pick up.

## 2018-05-19 ENCOUNTER — Encounter: Payer: Self-pay | Admitting: Family Medicine

## 2018-05-19 ENCOUNTER — Ambulatory Visit: Payer: BLUE CROSS/BLUE SHIELD | Admitting: Family Medicine

## 2018-05-19 ENCOUNTER — Other Ambulatory Visit: Payer: Self-pay

## 2018-05-19 VITALS — BP 122/65 | HR 75 | Ht 69.0 in | Wt 282.0 lb

## 2018-05-19 DIAGNOSIS — E119 Type 2 diabetes mellitus without complications: Secondary | ICD-10-CM | POA: Diagnosis not present

## 2018-05-19 DIAGNOSIS — I10 Essential (primary) hypertension: Secondary | ICD-10-CM

## 2018-05-19 DIAGNOSIS — Z9989 Dependence on other enabling machines and devices: Secondary | ICD-10-CM

## 2018-05-19 DIAGNOSIS — K219 Gastro-esophageal reflux disease without esophagitis: Secondary | ICD-10-CM

## 2018-05-19 DIAGNOSIS — G4733 Obstructive sleep apnea (adult) (pediatric): Secondary | ICD-10-CM

## 2018-05-19 DIAGNOSIS — E291 Testicular hypofunction: Secondary | ICD-10-CM

## 2018-05-19 LAB — POCT GLYCOSYLATED HEMOGLOBIN (HGB A1C): Hemoglobin A1C: 6.4 % — AB (ref 4.0–5.6)

## 2018-05-19 NOTE — Progress Notes (Signed)
Subjective:    CC:   HPI:  Diabetes - no hypoglycemic events. No wounds or sores that are not healing well. No increased thirst or urination. Checking glucose at home. Taking medications as prescribed without any side effects.  Male hypoogonadism - he felt like the testosterone gel didn't really help.  Though he never followed up for repeat labs to titrate his dose.   Hypertension- Pt denies chest pain, SOB, dizziness, or heart palpitations.  Taking meds as directed w/o problems.  Denies medication side effects.    F/U OSA - he reports he is using it consistantly.  He found a cleaning solution that was reasonable.  He also c/o GERD sxs.  He reports that for several weeks he has been experiencing reflux and indigestion.  It is worse he notices when he eats red sauce or Verdene Rio it is bothering him enough that is making it difficult to go to sleep at night he has had to start sleeping on 2 pillows to get some symptom relief.  He is mostly been using Tums and that does help temporarily.  Past medical history, Surgical history, Family history not pertinant except as noted below, Social history, Allergies, and medications have been entered into the medical record, reviewed, and corrections made.   Review of Systems: No fevers, chills, night sweats, weight loss, chest pain, or shortness of breath.   Objective:    General: Well Developed, well nourished, and in no acute distress.  Neuro: Alert and oriented x3, extra-ocular muscles intact, sensation grossly intact.  HEENT: Normocephalic, atraumatic  Skin: Warm and dry, no rashes. Cardiac: Regular rate and rhythm, no murmurs rubs or gallops, no lower extremity edema.  Respiratory: Clear to auscultation bilaterally. Not using accessory muscles, speaking in full sentences.   Impression and Recommendations:    DM -uncontrolled though hemoglobin A1c is up from previous.  Just encourage him to continue to work on healthy diet and regular exercise.   He like to have him lose about 10 to 15 pounds. Lab Results  Component Value Date   HGBA1C 6.4 (A) 05/19/2018    Male hypoogonadism -right now he wants to hold off on doing any testosterone therapy but he is interested in rechecking his levels.  GERD -discussed treatment options including dietary change as well as medication.  Recommend a trial of over-the-counter omeprazole or Nexium taken about 20 minutes before evening meal.  If it is not improving his symptoms over the next couple weeks then please give [place] call back.  HTN -well controlled.  Continue current regimen.  OSA -using his CPAP regularly.  We will follow-up again in 6 months.

## 2018-05-19 NOTE — Patient Instructions (Signed)
Food Choices for Gastroesophageal Reflux Disease, Adult  When you have gastroesophageal reflux disease (GERD), the foods you eat and your eating habits are very important. Choosing the right foods can help ease the discomfort of GERD. Consider working with a diet and nutrition specialist (dietitian) to help you make healthy food choices.  What general guidelines should I follow?    Eating plan  · Choose healthy foods low in fat, such as fruits, vegetables, whole grains, low-fat dairy products, and lean meat, fish, and poultry.  · Eat frequent, small meals instead of three large meals each day. Eat your meals slowly, in a relaxed setting. Avoid bending over or lying down until 2-3 hours after eating.  · Limit high-fat foods such as fatty meats or fried foods.  · Limit your intake of oils, butter, and shortening to less than 8 teaspoons each day.  · Avoid the following:  ? Foods that cause symptoms. These may be different for different people. Keep a food diary to keep track of foods that cause symptoms.  ? Alcohol.  ? Drinking large amounts of liquid with meals.  ? Eating meals during the 2-3 hours before bed.  · Cook foods using methods other than frying. This may include baking, grilling, or broiling.  Lifestyle  · Maintain a healthy weight. Ask your health care provider what weight is healthy for you. If you need to lose weight, work with your health care provider to do so safely.  · Exercise for at least 30 minutes on 5 or more days each week, or as told by your health care provider.  · Avoid wearing clothes that fit tightly around your waist and chest.  · Do not use any products that contain nicotine or tobacco, such as cigarettes and e-cigarettes. If you need help quitting, ask your health care provider.  · Sleep with the head of your bed raised. Use a wedge under the mattress or blocks under the bed frame to raise the head of the bed.  What foods are not recommended?  The items listed may not be a complete  list. Talk with your dietitian about what dietary choices are best for you.  Grains  Pastries or quick breads with added fat. French toast.  Vegetables  Deep fried vegetables. French fries. Any vegetables prepared with added fat. Any vegetables that cause symptoms. For some people this may include tomatoes and tomato products, chili peppers, onions and garlic, and horseradish.  Fruits  Any fruits prepared with added fat. Any fruits that cause symptoms. For some people this may include citrus fruits, such as oranges, grapefruit, pineapple, and lemons.  Meats and other protein foods  High-fat meats, such as fatty beef or pork, hot dogs, ribs, ham, sausage, salami and bacon. Fried meat or protein, including fried fish and fried chicken. Nuts and nut butters.  Dairy  Whole milk and chocolate milk. Sour cream. Cream. Ice cream. Cream cheese. Milk shakes.  Beverages  Coffee and tea, with or without caffeine. Carbonated beverages. Sodas. Energy drinks. Fruit juice made with acidic fruits (such as orange or grapefruit). Tomato juice. Alcoholic drinks.  Fats and oils  Butter. Margarine. Shortening. Ghee.  Sweets and desserts  Chocolate and cocoa. Donuts.  Seasoning and other foods  Pepper. Peppermint and spearmint. Any condiments, herbs, or seasonings that cause symptoms. For some people, this may include curry, hot sauce, or vinegar-based salad dressings.  Summary  · When you have gastroesophageal reflux disease (GERD), food and lifestyle choices are very   important to help ease the discomfort of GERD.  · Eat frequent, small meals instead of three large meals each day. Eat your meals slowly, in a relaxed setting. Avoid bending over or lying down until 2-3 hours after eating.  · Limit high-fat foods such as fatty meat or fried foods.  This information is not intended to replace advice given to you by your health care provider. Make sure you discuss any questions you have with your health care provider.  Document Released:  02/16/2005 Document Revised: 02/18/2016 Document Reviewed: 02/18/2016  Elsevier Interactive Patient Education © 2019 [employer] Inc[employer]

## 2018-05-20 ENCOUNTER — Encounter: Payer: Self-pay | Admitting: Family Medicine

## 2018-05-20 DIAGNOSIS — K760 Fatty (change of) liver, not elsewhere classified: Secondary | ICD-10-CM | POA: Insufficient documentation

## 2018-05-20 LAB — LIPID PANEL
CHOL/HDL RATIO: 5.8 (calc) — AB (ref <5.0)
Cholesterol: 196 mg/dL (ref <200)
HDL: 34 mg/dL — ABNORMAL LOW (ref >=40)
LDL CHOLESTEROL (CALC): 113 mg/dL — AB
NON-HDL CHOLESTEROL (CALC): 162 mg/dL — AB (ref <130)
Triglycerides: 340 mg/dL — ABNORMAL HIGH (ref <150)

## 2018-05-20 LAB — COMPLETE METABOLIC PANEL WITH GFR
AG Ratio: 1.7 (calc) (ref 1.0–2.5)
ALBUMIN MSPROF: 4.5 g/dL (ref 3.6–5.1)
ALT: 69 U/L — ABNORMAL HIGH (ref 9–46)
AST: 43 U/L — ABNORMAL HIGH (ref 10–40)
Alkaline phosphatase (APISO): 76 U/L (ref 36–130)
BUN: 14 mg/dL (ref 7–25)
CALCIUM: 9.7 mg/dL (ref 8.6–10.3)
CO2: 27 mmol/L (ref 20–32)
Chloride: 102 mmol/L (ref 98–110)
Creat: 1.16 mg/dL (ref 0.60–1.35)
GFR, EST AFRICAN AMERICAN: 86 mL/min/{1.73_m2} (ref >=60)
GFR, EST NON AFRICAN AMERICAN: 75 mL/min/{1.73_m2} (ref >=60)
GLOBULIN: 2.6 g/dL (ref 1.9–3.7)
GLUCOSE: 104 mg/dL — AB (ref 65–99)
Potassium: 4 mmol/L (ref 3.5–5.3)
SODIUM: 139 mmol/L (ref 135–146)
TOTAL PROTEIN: 7.1 g/dL (ref 6.1–8.1)
Total Bilirubin: 0.4 mg/dL (ref 0.2–1.2)

## 2018-05-20 LAB — TESTOSTERONE TOTAL,FREE,BIO, MALES
ALBUMIN MSPROF: 4.5 g/dL (ref 3.6–5.1)
SEX HORMONE BINDING: 16 nmol/L (ref 10–50)
Testosterone: 147 ng/dL — ABNORMAL LOW (ref 250–827)

## 2018-05-20 LAB — CBC
HCT: 42.7 % (ref 38.5–50.0)
Hemoglobin: 14.4 g/dL (ref 13.2–17.1)
MCH: 27.7 pg (ref 27.0–33.0)
MCHC: 33.7 g/dL (ref 32.0–36.0)
MCV: 82.1 fL (ref 80.0–100.0)
MPV: 11.8 fL (ref 7.5–12.5)
PLATELETS: 219 10*3/uL (ref 140–400)
RBC: 5.2 10*6/uL (ref 4.20–5.80)
RDW: 14.7 % (ref 11.0–15.0)
WBC: 8.6 10*3/uL (ref 3.8–10.8)

## 2018-06-16 ENCOUNTER — Ambulatory Visit: Payer: BLUE CROSS/BLUE SHIELD | Admitting: Family Medicine

## 2018-06-28 DIAGNOSIS — G4733 Obstructive sleep apnea (adult) (pediatric): Secondary | ICD-10-CM | POA: Diagnosis not present

## 2018-08-09 IMAGING — DX DG FINGER RING 2+V*L*
3 series · 3 of 3 positions shown · non-contrast
Comparison: None.

CLINICAL DATA: Left ring finger pain, stiffness and swelling about
the DIP joint for several months.

EXAM:
LEFT RING FINGER 2+V

[finger ap]
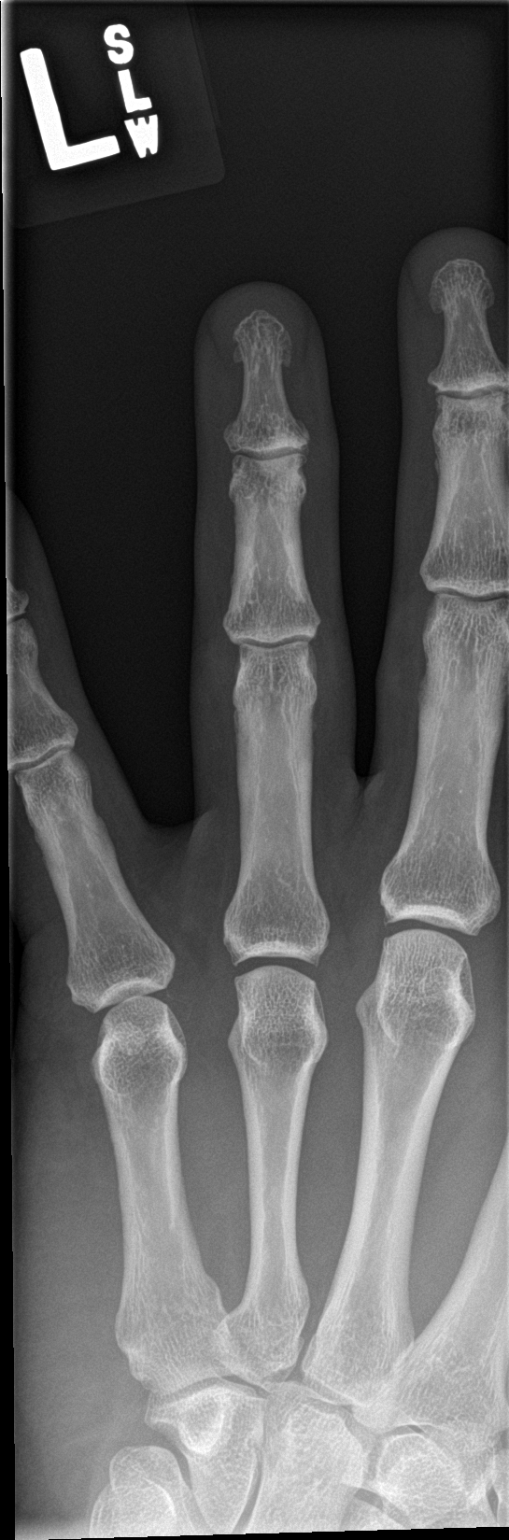

[finger obl]
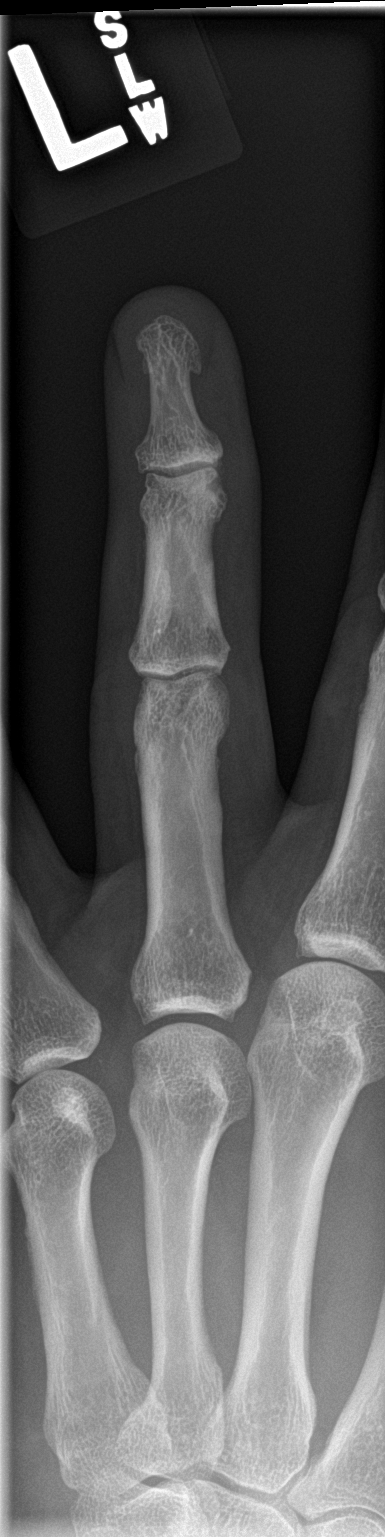

[finger lat]
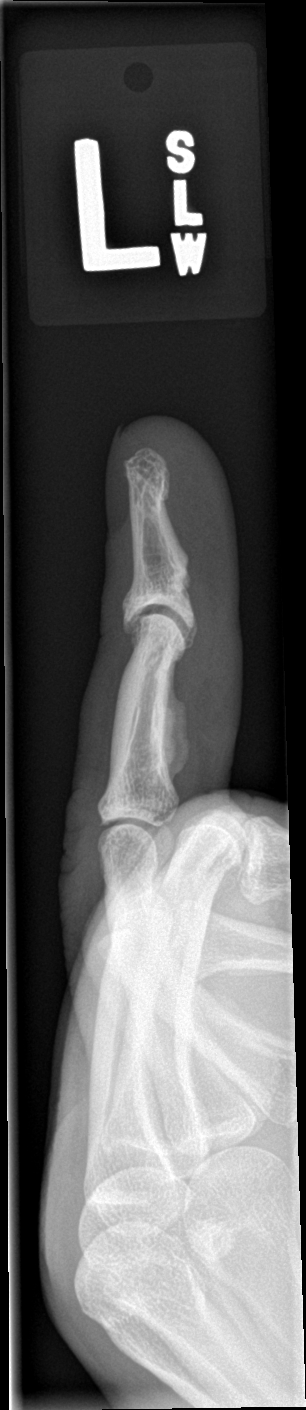

[3 of 3 positions shown; findings below may reference images not displayed]

FINDINGS: No acute bony or joint abnormality is identified. The patient has
osteoarthritis about the DIP joint with fairly prominent
osteophytosis and some joint space narrowing. Degenerative disease
about the DIP joint of the long finger is partially imaged. Imaged
joints otherwise appear normal. Soft tissues are unremarkable.
IMPRESSION: Moderate appearing osteoarthritis DIP joint left little finger.

Osteoarthritis about the DIP joint of the left long finger is
partially imaged.

No acute finding.

## 2018-09-30 DIAGNOSIS — G4733 Obstructive sleep apnea (adult) (pediatric): Secondary | ICD-10-CM | POA: Diagnosis not present

## 2018-11-24 ENCOUNTER — Ambulatory Visit (INDEPENDENT_AMBULATORY_CARE_PROVIDER_SITE_OTHER): Payer: BC Managed Care – PPO | Admitting: Family Medicine

## 2018-11-24 ENCOUNTER — Encounter: Payer: Self-pay | Admitting: Family Medicine

## 2018-11-24 ENCOUNTER — Other Ambulatory Visit: Payer: Self-pay

## 2018-11-24 VITALS — BP 129/81 | HR 80 | Ht 69.0 in | Wt 285.0 lb

## 2018-11-24 DIAGNOSIS — R748 Abnormal levels of other serum enzymes: Secondary | ICD-10-CM

## 2018-11-24 DIAGNOSIS — I10 Essential (primary) hypertension: Secondary | ICD-10-CM | POA: Diagnosis not present

## 2018-11-24 DIAGNOSIS — E119 Type 2 diabetes mellitus without complications: Secondary | ICD-10-CM

## 2018-11-24 DIAGNOSIS — K760 Fatty (change of) liver, not elsewhere classified: Secondary | ICD-10-CM | POA: Diagnosis not present

## 2018-11-24 LAB — POCT GLYCOSYLATED HEMOGLOBIN (HGB A1C): Hemoglobin A1C: 6.4 % — AB (ref 4.0–5.6)

## 2018-11-24 MED ORDER — SILDENAFIL CITRATE 100 MG PO TABS: 50.0000 mg | ORAL_TABLET | Freq: Every day | ORAL | 99 refills | Status: DC | PRN

## 2018-11-24 NOTE — Progress Notes (Signed)
[id number];Review Type:Prior Auth;Coverage Start Date:10/25/2018;Coverage End Date:11/24/2019;;CaseId:[id number];Status:Approved;Review Type:Qty;Coverage Start Date:10/25/2018;Coverage End Date:11/24/2019, Sildenafil approved and pharmacy notified.

## 2018-11-24 NOTE — Progress Notes (Signed)
Established Patient Office Visit  Subjective:  Patient ID: David Cole, male    DOB: 11/11/1970  Age: 48 y.o. MRN: [id number]  CC:  Chief Complaint  Patient presents with  . Diabetes    HPI David Cole presents for   Diabetes - no hypoglycemic events. No wounds or sores that are not healing well. No increased thirst or urination. Checking glucose at home. Taking medications as prescribed without any side effects.  Hypertension- Pt denies chest pain, SOB, dizziness, or heart palpitations.  Taking meds as directed w/o problems.  Denies medication side effects.    His liver enzymes were elevated back in March when we checked his last labs.  AST was 43 and ALT was 69.  He does have a history of fatty liver disease.     Past Medical History:  Diagnosis Date  . Hypertension     History reviewed. No pertinent surgical history.  Family History  Problem Relation Age of Onset  . Alcohol abuse Unknown        GF    Social History   Socioeconomic History  . Marital status: Married    Spouse name: [name]  . Number of children: 1  . Years of education: Not on file  . Highest education level: Not on file  Occupational History  . Occupation: Tax inspector: [employer]  Social Needs  . Financial resource strain: Not on file  . Food insecurity    Worry: Not on file    Inability: Not on file  . Transportation needs    Medical: Not on file    Non-medical: Not on file  Tobacco Use  . Smoking status: Never Smoker  . Smokeless tobacco: Never Used  Substance and Sexual Activity  . Alcohol use: Yes    Alcohol/week: 1.0 - 3.0 standard drinks    Types: 1 - 3 Standard drinks or equivalent per week    Comment: per week  . Drug use: No  . Sexual activity: Not on file  Lifestyle  . Physical activity    Days per week: Not on file    Minutes per session: Not on file  . Stress: Not on file  Relationships  . Social Herbalist on phone: Not on  file    Gets together: Not on file    Attends religious service: Not on file    Active member of club or organization: Not on file    Attends meetings of clubs or organizations: Not on file    Relationship status: Not on file  . Intimate partner violence    Fear of current or ex partner: Not on file    Emotionally abused: Not on file    Physically abused: Not on file    Forced sexual activity: Not on file  Other Topics Concern  . Not on file  Social History Narrative   1 caffeine drink per day. Some college     Outpatient Medications Prior to Visit  Medication Sig Dispense Refill  . AMBULATORY NON FORMULARY MEDICATION Medication Name: Set CPAP to Autopap for 2 weeks and then fax download so we can see if need to adjust pressure.  I think he uses Aeroflow. 1 vial 0  . atorvastatin (LIPITOR) 20 MG tablet TAKE 1 TABLET AT BEDTIME 90 tablet 4  . diclofenac sodium (VOLTAREN) 1 % GEL Apply 2 g topically 4 (four) times daily. To affected joint. 100 g 11  . glucose  blood (FREESTYLE TEST STRIPS) test strip USE TO CHECK FASTING BLOOD GLUCOSE DAILY 100 each 3  . glucose monitoring kit (FREESTYLE) monitoring kit 1 each by Does not apply route as needed for other. Dx Diabetes type 2. 1 each PRN  . JANUMET 50-1000 MG tablet TAKE 1 TABLET TWICE A DAY WITH MEALS 180 tablet 4  . Lancets (FREESTYLE) lancets USE TO CHECK FASTING BLOOD GLUCOSE DAILY 100 each 3  . metoprolol succinate (TOPROL-XL) 100 MG 24 hr tablet TAKE 1 TABLET DAILY WITH OR IMMEDIATELY FOLLOWING A MEAL 90 tablet 4  . Olmesartan-amLODIPine-HCTZ 40-5-25 MG TABS TAKE 1 TABLET DAILY 90 tablet 4  . sildenafil (VIAGRA) 100 MG tablet Take 0.5-1 tablets (50-100 mg total) by mouth daily as needed for erectile dysfunction. 10 tablet PRN   No facility-administered medications prior to visit.     No Known Allergies  ROS Review of Systems    Objective:    Physical Exam  Constitutional: He is oriented to person, place, and time. He appears  well-developed and well-nourished.  HENT:  Head: Normocephalic and atraumatic.  Cardiovascular: Normal rate, regular rhythm and normal heart sounds.  Pulmonary/Chest: Effort normal and breath sounds normal.  Neurological: He is alert and oriented to person, place, and time.  Skin: Skin is warm and dry.  Psychiatric: He has a normal mood and affect. His behavior is normal.    BP 129/81   Pulse 80   Ht '5\' 9"'  (1.753 m)   Wt 285 lb (129.3 kg)   SpO2 97%   BMI 42.09 kg/m  Wt Readings from Last 3 Encounters:  11/24/18 285 lb (129.3 kg)  05/19/18 282 lb (127.9 kg)  02/14/18 282 lb (127.9 kg)     Health Maintenance Due  Topic Date Due  . OPHTHALMOLOGY EXAM  08/18/1980  . HEMOGLOBIN A1C  11/19/2018    There are no preventive care reminders to display for this patient.  Lab Results  Component Value Date   TSH 1.643 09/15/2010   Lab Results  Component Value Date   WBC 8.6 05/19/2018   HGB 14.4 05/19/2018   HCT 42.7 05/19/2018   MCV 82.1 05/19/2018   PLT 219 05/19/2018   Lab Results  Component Value Date   NA 139 05/19/2018   K 4.0 05/19/2018   CO2 27 05/19/2018   GLUCOSE 104 (H) 05/19/2018   BUN 14 05/19/2018   CREATININE 1.16 05/19/2018   BILITOT 0.4 05/19/2018   ALKPHOS 95 03/17/2016   AST 43 (H) 05/19/2018   ALT 69 (H) 05/19/2018   PROT 7.1 05/19/2018   ALBUMIN 4.6 03/17/2016   CALCIUM 9.7 05/19/2018   Lab Results  Component Value Date   CHOL 196 05/19/2018   Lab Results  Component Value Date   HDL 34 (L) 05/19/2018   Lab Results  Component Value Date   LDLCALC 113 (H) 05/19/2018   Lab Results  Component Value Date   TRIG 340 (H) 05/19/2018   Lab Results  Component Value Date   CHOLHDL 5.8 (H) 05/19/2018   Lab Results  Component Value Date   HGBA1C 6.4 (A) 11/24/2018      Assessment & Plan:   Problem List Items Addressed This Visit      Cardiovascular and Mediastinum   Essential hypertension, benign    Well controlled. Continue  current regimen. Follow up in  4 mo      Relevant Medications   sildenafil (VIAGRA) 100 MG tablet   Other Relevant Orders   COMPLETE  METABOLIC PANEL WITH GFR     Digestive   Fatty liver    Recheck liver enzymes today.         Endocrine   Type 2 diabetes mellitus without complication, without long-term current use of insulin ([place]) - Primary    Well controlled.  A1c looks great today at 6.4.  Continue current regimen.  Follow-up in 4 months.      Relevant Orders   POCT glycosylated hemoglobin (Hb A1C) (Completed)   COMPLETE METABOLIC PANEL WITH GFR    Other Visit Diagnoses    Elevated liver enzymes       Relevant Orders   COMPLETE METABOLIC PANEL WITH GFR      Meds ordered this encounter  Medications  . sildenafil (VIAGRA) 100 MG tablet    Sig: Take 0.5-1 tablets (50-100 mg total) by mouth daily as needed for erectile dysfunction.    Dispense:  10 tablet    Refill:  PRN    Follow-up: Return in about 4 months (around 03/26/2019) for Diabetes follow-up.    Beatrice Lecher, MD

## 2018-11-24 NOTE — Assessment & Plan Note (Signed)
Recheck liver enzymes today.

## 2018-11-24 NOTE — Assessment & Plan Note (Signed)
Well controlled. Continue current regimen. Follow up in  4 mo 

## 2018-11-24 NOTE — Assessment & Plan Note (Signed)
Well controlled.  A1c looks great today at 6.4.  Continue current regimen.  Follow-up in 4 months.

## 2019-01-02 DIAGNOSIS — G4733 Obstructive sleep apnea (adult) (pediatric): Secondary | ICD-10-CM | POA: Diagnosis not present

## 2019-02-16 ENCOUNTER — Other Ambulatory Visit: Payer: Self-pay | Admitting: Family Medicine

## 2019-03-27 ENCOUNTER — Ambulatory Visit: Payer: BC Managed Care – PPO | Admitting: Family Medicine

## 2019-03-27 NOTE — Progress Notes (Deleted)
Established Patient Office Visit  Subjective:  Patient ID: David Cole, male    DOB: 1970-06-07  Age: 49 y.o. MRN: [id number]  CC: No chief complaint on file.   HPI [name] presents for   Hypertension- Pt denies chest pain, SOB, dizziness, or heart palpitations.  Taking meds as directed w/o problems.  Denies medication side effects.    Diabetes - no hypoglycemic events. No wounds or sores that are not healing well. No increased thirst or urination. Checking glucose at home. Taking medications as prescribed without any side effects.  Follow-up mild elevation of liver enzymes in March.  Patient was supposed to go back for repeat but never did.  It was reordered in September but again never went.  Positive history of fatty liver by ultrasound.   Past Medical History:  Diagnosis Date  . Hypertension     No past surgical history on file.  Family History  Problem Relation Age of Onset  . Alcohol abuse Unknown        GF    Social History   Socioeconomic History  . Marital status: Married    Spouse name: [name]  . Number of children: 1  . Years of education: Not on file  . Highest education level: Not on file  Occupational History  . Occupation: Tax inspector: [employer]  Tobacco Use  . Smoking status: Never Smoker  . Smokeless tobacco: Never Used  Substance and Sexual Activity  . Alcohol use: Yes    Alcohol/week: 1.0 - 3.0 standard drinks    Types: 1 - 3 Standard drinks or equivalent per week    Comment: per week  . Drug use: No  . Sexual activity: Not on file  Other Topics Concern  . Not on file  Social History Narrative   1 caffeine drink per day. Some college    Social Determinants of Health   Financial Resource Strain:   . Difficulty of Paying Living Expenses: Not on file  Food Insecurity:   . Worried About Charity fundraiser in the Last Year: Not on file  . Ran Out of Food in the Last Year: Not on file  Transportation  Needs:   . Lack of Transportation (Medical): Not on file  . Lack of Transportation (Non-Medical): Not on file  Physical Activity:   . Days of Exercise per Week: Not on file  . Minutes of Exercise per Session: Not on file  Stress:   . Feeling of Stress : Not on file  Social Connections:   . Frequency of Communication with Friends and Family: Not on file  . Frequency of Social Gatherings with Friends and Family: Not on file  . Attends Religious Services: Not on file  . Active Member of Clubs or Organizations: Not on file  . Attends Archivist Meetings: Not on file  . Marital Status: Not on file  Intimate Partner Violence:   . Fear of Current or Ex-Partner: Not on file  . Emotionally Abused: Not on file  . Physically Abused: Not on file  . Sexually Abused: Not on file    Outpatient Medications Prior to Visit  Medication Sig Dispense Refill  . AMBULATORY NON FORMULARY MEDICATION Medication Name: Set CPAP to Autopap for 2 weeks and then fax download so we can see if need to adjust pressure.  I think he uses Aeroflow. 1 vial 0  . atorvastatin (LIPITOR) 20 MG tablet TAKE 1 TABLET AT BEDTIME  90 tablet 4  . diclofenac sodium (VOLTAREN) 1 % GEL Apply 2 g topically 4 (four) times daily. To affected joint. 100 g 11  . glucose blood (FREESTYLE TEST STRIPS) test strip USE TO CHECK FASTING BLOOD GLUCOSE DAILY 100 each 3  . glucose monitoring kit (FREESTYLE) monitoring kit 1 each by Does not apply route as needed for other. Dx Diabetes type 2. 1 each PRN  . JANUMET 50-1000 MG tablet TAKE 1 TABLET TWICE A DAY WITH MEALS 180 tablet 4  . Lancets (FREESTYLE) lancets USE TO CHECK FASTING BLOOD GLUCOSE DAILY 100 each 3  . metoprolol succinate (TOPROL-XL) 100 MG 24 hr tablet TAKE 1 TABLET DAILY WITH OR IMMEDIATELY FOLLOWING A MEAL 90 tablet 3  . Olmesartan-amLODIPine-HCTZ 40-5-25 MG TABS TAKE 1 TABLET DAILY 90 tablet 4  . sildenafil (VIAGRA) 100 MG tablet Take 0.5-1 tablets (50-100 mg total) by  mouth daily as needed for erectile dysfunction. 10 tablet PRN   No facility-administered medications prior to visit.    No Known Allergies  ROS Review of Systems    Objective:    Physical Exam  There were no vitals taken for this visit. Wt Readings from Last 3 Encounters:  11/24/18 285 lb (129.3 kg)  05/19/18 282 lb (127.9 kg)  02/14/18 282 lb (127.9 kg)     Health Maintenance Due  Topic Date Due  . PNEUMOCOCCAL POLYSACCHARIDE VACCINE AGE 30-64 HIGH RISK  08/18/1972  . OPHTHALMOLOGY EXAM  08/18/1980    There are no preventive care reminders to display for this patient.  Lab Results  Component Value Date   TSH 1.643 09/15/2010   Lab Results  Component Value Date   WBC 8.6 05/19/2018   HGB 14.4 05/19/2018   HCT 42.7 05/19/2018   MCV 82.1 05/19/2018   PLT 219 05/19/2018   Lab Results  Component Value Date   NA 139 05/19/2018   K 4.0 05/19/2018   CO2 27 05/19/2018   GLUCOSE 104 (H) 05/19/2018   BUN 14 05/19/2018   CREATININE 1.16 05/19/2018   BILITOT 0.4 05/19/2018   ALKPHOS 95 03/17/2016   AST 43 (H) 05/19/2018   ALT 69 (H) 05/19/2018   PROT 7.1 05/19/2018   ALBUMIN 4.6 03/17/2016   CALCIUM 9.7 05/19/2018   Lab Results  Component Value Date   CHOL 196 05/19/2018   Lab Results  Component Value Date   HDL 34 (L) 05/19/2018   Lab Results  Component Value Date   LDLCALC 113 (H) 05/19/2018   Lab Results  Component Value Date   TRIG 340 (H) 05/19/2018   Lab Results  Component Value Date   CHOLHDL 5.8 (H) 05/19/2018   Lab Results  Component Value Date   HGBA1C 6.4 (A) 11/24/2018      Assessment & Plan:   Problem List Items Addressed This Visit      Cardiovascular and Mediastinum   Essential hypertension, benign     Digestive   Fatty liver    History of fatty liver noted back in 2017.  Due to repeat liver enzymes.        Endocrine   Type 2 diabetes mellitus without complication, without long-term current use of insulin ([place]) -  Primary    Other Visit Diagnoses    Elevated liver enzymes          No orders of the defined types were placed in this encounter.   Follow-up: No follow-ups on file.    Beatrice Lecher, MD

## 2019-03-27 NOTE — Assessment & Plan Note (Deleted)
History of fatty liver noted back in 2017.  Due to repeat liver enzymes.

## 2019-04-20 DIAGNOSIS — G4733 Obstructive sleep apnea (adult) (pediatric): Secondary | ICD-10-CM | POA: Diagnosis not present

## 2019-05-24 ENCOUNTER — Other Ambulatory Visit: Payer: Self-pay | Admitting: Family Medicine

## 2019-05-24 DIAGNOSIS — I10 Essential (primary) hypertension: Secondary | ICD-10-CM

## 2019-05-25 ENCOUNTER — Other Ambulatory Visit: Payer: Self-pay

## 2019-05-25 ENCOUNTER — Encounter: Payer: Self-pay | Admitting: Family Medicine

## 2019-05-25 ENCOUNTER — Ambulatory Visit: Payer: BC Managed Care – PPO | Admitting: Family Medicine

## 2019-05-25 VITALS — BP 113/63 | HR 71 | Ht 69.0 in | Wt 276.0 lb

## 2019-05-25 DIAGNOSIS — Z9989 Dependence on other enabling machines and devices: Secondary | ICD-10-CM | POA: Diagnosis not present

## 2019-05-25 DIAGNOSIS — G4733 Obstructive sleep apnea (adult) (pediatric): Secondary | ICD-10-CM

## 2019-05-25 DIAGNOSIS — I10 Essential (primary) hypertension: Secondary | ICD-10-CM

## 2019-05-25 DIAGNOSIS — E119 Type 2 diabetes mellitus without complications: Secondary | ICD-10-CM | POA: Diagnosis not present

## 2019-05-25 LAB — POCT GLYCOSYLATED HEMOGLOBIN (HGB A1C): Hemoglobin A1C: 6.3 % — AB (ref 4.0–5.6)

## 2019-05-25 NOTE — Assessment & Plan Note (Addendum)
Well controlled.  A1c actually down slightly to 6.3 which is great.  He is actually lost some weight and has been a little bit more active and changed his diet recently.  Currently on an ARB and a statin.

## 2019-05-25 NOTE — Assessment & Plan Note (Signed)
Doing well on current CPAP setting. If continue to lose weight then will recheck CPAP pressure needs.

## 2019-05-25 NOTE — Progress Notes (Signed)
Established Patient Office Visit  Subjective:  Patient ID: David Cole, male    DOB: 1970/06/30  Age: 49 y.o. MRN: [id number]  CC:  Chief Complaint  Patient presents with  . Diabetes  . Hypertension    HPI3 David Cole presents for   Hypertension- Pt denies chest pain, SOB, dizziness, or heart palpitations.  Taking meds as directed w/o problems.  Denies medication side effects.      Diabetes - no hypoglycemic events. No wounds or sores that are not healing well. No increased thirst or urination. Checking glucose at home. Taking medications as prescribed without any side effects.  Exam performed today.  Patient has had an up-to-date eye exam will call for report.  He did have a few questions about the Covid vaccine he says he thinks he is not interested in getting it.  Obstructive sleep apnea-he says he wears his mask every night.  His sometimes get a little bit of nasal congestion irritation if he knows he needs to clean his mask.  But otherwise he is doing really well with it.  Let me know he is taking 2 melatonin Gummies at night to help him sleep but it seems to be working well.   Past Medical History:  Diagnosis Date  . Hypertension     No past surgical history on file.  Family History  Problem Relation Age of Onset  . Alcohol abuse Unknown        GF    Social History   Socioeconomic History  . Marital status: Married    Spouse name: [name]  . Number of children: 1  . Years of education: Not on file  . Highest education level: Not on file  Occupational History  . 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 Smoking status: Never Smoker  . Smokeless tobacco: Never Used  Substance and Sexual Activity  . Alcohol use: Yes    Alcohol/week: 1.0 - 3.0 standard drinks    Types: 1 - 3 Standard drinks or equivalent per week    Comment: per week  . Drug use: No  . Sexual activity: Not on file  Other Topics Concern  . Not on  file  Social History Narrative   1 caffeine drink per day. Some college    Social Determinants of Health   Financial Resource Strain:   . Difficulty of Paying Living Expenses:   Food Insecurity:   . Worried About Charity fundraiser in the Last Year:   . Arboriculturist in the Last Year:   Transportation Needs:   . Film/video editor (Medical):   Marland Kitchen Lack of Transportation (Non-Medical):   Physical Activity:   . Days of Exercise per Week:   . Minutes of Exercise per Session:   Stress:   . Feeling of Stress :   Social Connections:   . Frequency of Communication with Friends and Family:   . Frequency of Social Gatherings with Friends and Family:   . Attends Religious Services:   . Active Member of Clubs or Organizations:   . Attends Archivist Meetings:   Marland Kitchen Marital Status:   Intimate Partner Violence:   . Fear of Current or Ex-Partner:   . Emotionally Abused:   Marland Kitchen Physically Abused:   . Sexually Abused:     Outpatient Medications Prior to Visit  Medication Sig Dispense Refill  . AMBULATORY NON FORMULARY MEDICATION Medication Name: Set CPAP to  Autopap for 2 weeks and then fax download so we can see if need to adjust pressure.  I think he uses Aeroflow. 1 vial 0  . atorvastatin (LIPITOR) 20 MG tablet TAKE 1 TABLET AT BEDTIME 90 tablet 4  . diclofenac sodium (VOLTAREN) 1 % GEL Apply 2 g topically 4 (four) times daily. To affected joint. 100 g 11  . glucose blood (FREESTYLE TEST STRIPS) test strip USE TO CHECK FASTING BLOOD GLUCOSE DAILY 100 each 3  . glucose monitoring kit (FREESTYLE) monitoring kit 1 each by Does not apply route as needed for other. Dx Diabetes type 2. 1 each PRN  . JANUMET 50-1000 MG tablet TAKE 1 TABLET TWICE A DAY WITH MEALS 180 tablet 4  . Lancets (FREESTYLE) lancets USE TO CHECK FASTING BLOOD GLUCOSE DAILY 100 each 3  . metoprolol succinate (TOPROL-XL) 100 MG 24 hr tablet TAKE 1 TABLET DAILY WITH OR IMMEDIATELY FOLLOWING A MEAL 90 tablet 3  .  Olmesartan-amLODIPine-HCTZ 40-5-25 MG TABS TAKE 1 TABLET DAILY 90 tablet 0  . sildenafil (VIAGRA) 100 MG tablet Take 0.5-1 tablets (50-100 mg total) by mouth daily as needed for erectile dysfunction. 10 tablet PRN   No facility-administered medications prior to visit.    No Known Allergies  ROS Review of Systems    Objective:    Physical Exam  Constitutional: He is oriented to person, place, and time. He appears well-developed and well-nourished.  HENT:  Head: Normocephalic and atraumatic.  Cardiovascular: Normal rate, regular rhythm and normal heart sounds.  Pulmonary/Chest: Effort normal and breath sounds normal.  Neurological: He is alert and oriented to person, place, and time.  Skin: Skin is warm and dry.  Psychiatric: He has a normal mood and affect. His behavior is normal.    BP 130/74   Pulse 78   Ht '5\' 9"'  (1.753 m)   Wt 276 lb (125.2 kg)   SpO2 98%   BMI 40.76 kg/m  Wt Readings from Last 3 Encounters:  05/25/19 276 lb (125.2 kg)  11/24/18 285 lb (129.3 kg)  05/19/18 282 lb (127.9 kg)     Health Maintenance Due  Topic Date Due  . PNEUMOCOCCAL POLYSACCHARIDE VACCINE AGE 87-64 HIGH RISK  Never done  . OPHTHALMOLOGY EXAM  Never done    There are no preventive care reminders to display for this patient.  Lab Results  Component Value Date   TSH 1.643 09/15/2010   Lab Results  Component Value Date   WBC 8.6 05/19/2018   HGB 14.4 05/19/2018   HCT 42.7 05/19/2018   MCV 82.1 05/19/2018   PLT 219 05/19/2018   Lab Results  Component Value Date   NA 139 05/19/2018   K 4.0 05/19/2018   CO2 27 05/19/2018   GLUCOSE 104 (H) 05/19/2018   BUN 14 05/19/2018   CREATININE 1.16 05/19/2018   BILITOT 0.4 05/19/2018   ALKPHOS 95 03/17/2016   AST 43 (H) 05/19/2018   ALT 69 (H) 05/19/2018   PROT 7.1 05/19/2018   ALBUMIN 4.6 03/17/2016   CALCIUM 9.7 05/19/2018   Lab Results  Component Value Date   CHOL 196 05/19/2018   Lab Results  Component Value Date    HDL 34 (L) 05/19/2018   Lab Results  Component Value Date   LDLCALC 113 (H) 05/19/2018   Lab Results  Component Value Date   TRIG 340 (H) 05/19/2018   Lab Results  Component Value Date   CHOLHDL 5.8 (H) 05/19/2018   Lab Results  Component Value  Date   HGBA1C 6.3 (A) 05/25/2019      Assessment & Plan:   Problem List Items Addressed This Visit      Cardiovascular and Mediastinum   Essential hypertension, benign    Well controlled. Continue current regimen. Follow up in  6 mo      Relevant Orders   COMPLETE METABOLIC PANEL WITH GFR   Lipid panel     Respiratory   OSA on CPAP    Doing well on current CPAP setting. If continue to lose weight then will recheck CPAP pressure needs.         Endocrine   Type 2 diabetes mellitus without complication, without long-term current use of insulin ([place]) - Primary    Well controlled.  A1c actually down slightly to 6.3 which is great.  He is actually lost some weight and has been a little bit more active and changed his diet recently.  Currently on an ARB and a statin.      Relevant Orders   POCT glycosylated hemoglobin (Hb A1C) (Completed)   COMPLETE METABOLIC PANEL WITH GFR   Lipid panel      No orders of the defined types were placed in this encounter.   Follow-up: Return in about 4 months (around 09/24/2019) for Diabetes follow-up, Hypertension.    Beatrice Lecher, MD

## 2019-05-25 NOTE — Assessment & Plan Note (Signed)
Well controlled. Continue current regimen. Follow up in  6 mo  

## 2019-05-26 LAB — LIPID PANEL
Cholesterol: 149 mg/dL (ref <200)
HDL: 31 mg/dL — ABNORMAL LOW (ref >=40)
LDL Cholesterol (Calc): 80 mg/dL (calc)
Non-HDL Cholesterol (Calc): 118 mg/dL (calc) (ref <130)
Total CHOL/HDL Ratio: 4.8 (calc) (ref <5.0)
Triglycerides: 290 mg/dL — ABNORMAL HIGH (ref <150)

## 2019-05-26 LAB — COMPLETE METABOLIC PANEL WITH GFR
AG Ratio: 1.7 (calc) (ref 1.0–2.5)
ALT: 53 U/L — ABNORMAL HIGH (ref 9–46)
AST: 33 U/L (ref 10–40)
Albumin: 4.4 g/dL (ref 3.6–5.1)
Alkaline phosphatase (APISO): 82 U/L (ref 36–130)
BUN: 14 mg/dL (ref 7–25)
CO2: 28 mmol/L (ref 20–32)
Calcium: 9.5 mg/dL (ref 8.6–10.3)
Chloride: 101 mmol/L (ref 98–110)
Creat: 1.05 mg/dL (ref 0.60–1.35)
GFR, Est African American: 97 mL/min/{1.73_m2} (ref >=60)
GFR, Est Non African American: 84 mL/min/{1.73_m2} (ref >=60)
Globulin: 2.6 g/dL (calc) (ref 1.9–3.7)
Glucose, Bld: 98 mg/dL (ref 65–99)
Potassium: 4 mmol/L (ref 3.5–5.3)
Sodium: 139 mmol/L (ref 135–146)
Total Bilirubin: 0.7 mg/dL (ref 0.2–1.2)
Total Protein: 7 g/dL (ref 6.1–8.1)

## 2019-06-07 ENCOUNTER — Other Ambulatory Visit: Payer: Self-pay | Admitting: Family Medicine

## 2019-06-07 DIAGNOSIS — E119 Type 2 diabetes mellitus without complications: Secondary | ICD-10-CM

## 2019-07-19 ENCOUNTER — Other Ambulatory Visit: Payer: Self-pay

## 2019-07-19 ENCOUNTER — Ambulatory Visit: Payer: BC Managed Care – PPO | Admitting: Family Medicine

## 2019-07-19 ENCOUNTER — Telehealth: Payer: Self-pay | Admitting: *Deleted

## 2019-07-19 ENCOUNTER — Ambulatory Visit (INDEPENDENT_AMBULATORY_CARE_PROVIDER_SITE_OTHER): Payer: BC Managed Care – PPO

## 2019-07-19 ENCOUNTER — Encounter: Payer: Self-pay | Admitting: Family Medicine

## 2019-07-19 VITALS — BP 138/84 | HR 95 | Ht 69.0 in | Wt 279.0 lb

## 2019-07-19 DIAGNOSIS — M25562 Pain in left knee: Secondary | ICD-10-CM | POA: Diagnosis not present

## 2019-07-19 DIAGNOSIS — M25462 Effusion, left knee: Secondary | ICD-10-CM | POA: Diagnosis not present

## 2019-07-19 MED ORDER — MELOXICAM 15 MG PO TABS: 15.0000 mg | ORAL_TABLET | Freq: Every day | ORAL | 0 refills | Status: DC

## 2019-07-19 NOTE — Progress Notes (Signed)
Acute Office Visit  Subjective:    Patient ID: David Cole, male    DOB: Sep 04, 1970, 49 y.o.   MRN: [id number]  Chief Complaint  Patient presents with  . Knee Pain    HPI Patient is in today for left knee pain for about 2 days.  He says Sunday he was sitting for a very prolonged period of time with the knee is just slightly bent.  And says that the next day he started having knee pain that was sharp a little bit more medial and superior.  He rates it a 5 out of 10.  He noticed it was really difficult to actually flex his knee.  It feels more comfortable when it straight.  But if he has held it straight for a while and then tries to flex it it is even more painful.  He says been swollen and hot to touch he has been trying heat, ice, Aleve without any significant improvement he is having to use a cane because it is painful to stand and put pressure on the knee.  He did feel a pop in the knee a couple times since the pain started but they were not painful pops.  He did note that he had injured the knee a couple of years ago when he was lifting a treadmill to help someone move and had a sudden pop and then swelling for several days afterwards but again that was a couple years ago.  Past Medical History:  Diagnosis Date  . Hypertension     No past surgical history on file.  Family History  Problem Relation Age of Onset  . Alcohol abuse Unknown        GF    Social History   Socioeconomic History  . Marital status: Married    Spouse name: [name]  . Number of children: 1  . Years of education: Not on file  . Highest education level: Not on file  Occupational History  . Occupation: Tax inspector: [employer]  Tobacco Use  . Smoking status: Never Smoker  . Smokeless tobacco: Never Used  Substance and Sexual Activity  . Alcohol use: Yes    Alcohol/week: 1.0 - 3.0 standard drinks    Types: 1 - 3 Standard drinks or equivalent per week    Comment: per week   . Drug use: No  . Sexual activity: Not on file  Other Topics Concern  . Not on file  Social History Narrative   1 caffeine drink per day. Some college    Social Determinants of Health   Financial Resource Strain:   . Difficulty of Paying Living Expenses:   Food Insecurity:   . Worried About Charity fundraiser in the Last Year:   . Arboriculturist in the Last Year:   Transportation Needs:   . Film/video editor (Medical):   Marland Kitchen Lack of Transportation (Non-Medical):   Physical Activity:   . Days of Exercise per Week:   . Minutes of Exercise per Session:   Stress:   . Feeling of Stress :   Social Connections:   . Frequency of Communication with Friends and Family:   . Frequency of Social Gatherings with Friends and Family:   . Attends Religious Services:   . Active Member of Clubs or Organizations:   . Attends Archivist Meetings:   Marland Kitchen Marital Status:   Intimate Partner Violence:   . Fear of Current  or Ex-Partner:   . Emotionally Abused:   Marland Kitchen Physically Abused:   . Sexually Abused:     Outpatient Medications Prior to Visit  Medication Sig Dispense Refill  . AMBULATORY NON FORMULARY MEDICATION Medication Name: Set CPAP to Autopap for 2 weeks and then fax download so we can see if need to adjust pressure.  I think he uses Aeroflow. 1 vial 0  . atorvastatin (LIPITOR) 20 MG tablet TAKE 1 TABLET AT BEDTIME 90 tablet 4  . diclofenac sodium (VOLTAREN) 1 % GEL Apply 2 g topically 4 (four) times daily. To affected joint. 100 g 11  . glucose blood (FREESTYLE TEST STRIPS) test strip USE TO CHECK FASTING BLOOD GLUCOSE DAILY 100 each 3  . glucose monitoring kit (FREESTYLE) monitoring kit 1 each by Does not apply route as needed for other. Dx Diabetes type 2. 1 each PRN  . JANUMET 50-1000 MG tablet TAKE 1 TABLET TWICE A DAY WITH MEALS 180 tablet 3  . Lancets (FREESTYLE) lancets USE TO CHECK FASTING BLOOD GLUCOSE DAILY 100 each 3  . metoprolol succinate (TOPROL-XL) 100 MG 24  hr tablet TAKE 1 TABLET DAILY WITH OR IMMEDIATELY FOLLOWING A MEAL 90 tablet 3  . Olmesartan-amLODIPine-HCTZ 40-5-25 MG TABS TAKE 1 TABLET DAILY 90 tablet 0  . sildenafil (VIAGRA) 100 MG tablet Take 0.5-1 tablets (50-100 mg total) by mouth daily as needed for erectile dysfunction. 10 tablet PRN   No facility-administered medications prior to visit.    No Known Allergies  Review of Systems     Objective:    Physical Exam Vitals reviewed.  Constitutional:      Appearance: He is well-developed.  HENT:     Head: Normocephalic and atraumatic.  Eyes:     Conjunctiva/sclera: Conjunctivae normal.  Cardiovascular:     Rate and Rhythm: Normal rate.  Pulmonary:     Effort: Pulmonary effort is normal.  Musculoskeletal:     Comments: Left knee with significant decreased range of motion particularly with flexion.  Negative anterior drawer test.  Nontender over the medial lateral joint lines he is tender superiorly along the patella.  He does have some significant swelling superior medially.  Able to perform a McMurray's because is unable to get his knee fully flexed to 90 degrees.  Skin:    General: Skin is dry.     Coloration: Skin is not pale.  Neurological:     Mental Status: He is alert and oriented to person, place, and time.  Psychiatric:        Behavior: Behavior normal.     BP 138/84   Pulse 95   Ht _0  (1.753 m)   Wt 279 lb (126.6 kg)   SpO2 99%   BMI 41.20 kg/m  Wt Readings from Last 3 Encounters:  07/19/19 279 lb (126.6 kg)  05/25/19 276 lb (125.2 kg)  11/24/18 285 lb (129.3 kg)    Health Maintenance Due  Topic Date Due  . PNEUMOCOCCAL POLYSACCHARIDE VACCINE AGE 86-64 HIGH RISK  Never done  . OPHTHALMOLOGY EXAM  Never done    There are no preventive care reminders to display for this patient.   Lab Results  Component Value Date   TSH 1.643 09/15/2010   Lab Results  Component Value Date   WBC 8.6 05/19/2018   HGB 14.4 05/19/2018   HCT 42.7 05/19/2018    MCV 82.1 05/19/2018   PLT 219 05/19/2018   Lab Results  Component Value Date   NA 139 05/25/2019  K 4.0 05/25/2019   CO2 28 05/25/2019   GLUCOSE 98 05/25/2019   BUN 14 05/25/2019   CREATININE 1.05 05/25/2019   BILITOT 0.7 05/25/2019   ALKPHOS 95 03/17/2016   AST 33 05/25/2019   ALT 53 (H) 05/25/2019   PROT 7.0 05/25/2019   ALBUMIN 4.6 03/17/2016   CALCIUM 9.5 05/25/2019   Lab Results  Component Value Date   CHOL 149 05/25/2019   Lab Results  Component Value Date   HDL 31 (L) 05/25/2019   Lab Results  Component Value Date   LDLCALC 80 05/25/2019   Lab Results  Component Value Date   TRIG 290 (H) 05/25/2019   Lab Results  Component Value Date   CHOLHDL 4.8 05/25/2019   Lab Results  Component Value Date   HGBA1C 6.3 (A) 05/25/2019       Assessment & Plan:   Problem List Items Addressed This Visit    None    Visit Diagnoses    Acute pain of left knee    -  Primary   Relevant Orders   DG Knee Complete 4 Views Left     Acute left knee pain without any specific trauma or injury.  He is actually quite uncomfortable today and really is having pain with flexion of the knee.  He is using a cane.  We will start with x-rays though I think they will be negative I think it really benefit from an MRI.  Question whether or not he could have a meniscal tear that is causing significant swelling and pain especially with flexion.  If x-ray is normal we will go ahead and move forward with MRI.  Also consider sports medicine referral to have some fluid drawn off the knee.  It is clearly very swollen on exam.  Meds ordered this encounter  Medications  . meloxicam (MOBIC) 15 MG tablet    Sig: Take 1 tablet (15 mg total) by mouth daily.    Dispense:  30 tablet    Refill:  0     Beatrice Lecher, MD

## 2019-07-19 NOTE — Telephone Encounter (Signed)
Pt asked about getting a letter to excuse him from jury duty. I advised him that Dr. Madilyn Fireman asked that he call tomorrow if he is actually selected for jury duty and she will write the letter for him at that time. He voiced understanding and agreed.

## 2019-07-19 NOTE — Progress Notes (Signed)
Pt reports that he began to have L knee pain 4 days ago. He reports that it is sharp,intense,and constant. The pain is 5/10. He has been using ice/heat and tried Aleve. He said that the Aleve didn't work.   He does report having a previous injury to his L knee some years ago.

## 2019-07-20 ENCOUNTER — Encounter: Payer: Self-pay | Admitting: Family Medicine

## 2019-07-20 ENCOUNTER — Other Ambulatory Visit: Payer: Self-pay | Admitting: Family Medicine

## 2019-07-20 DIAGNOSIS — M25562 Pain in left knee: Secondary | ICD-10-CM

## 2019-07-22 ENCOUNTER — Other Ambulatory Visit: Payer: Self-pay

## 2019-07-22 ENCOUNTER — Ambulatory Visit (INDEPENDENT_AMBULATORY_CARE_PROVIDER_SITE_OTHER): Payer: BC Managed Care – PPO

## 2019-07-22 DIAGNOSIS — M25462 Effusion, left knee: Secondary | ICD-10-CM | POA: Diagnosis not present

## 2019-07-22 DIAGNOSIS — M25562 Pain in left knee: Secondary | ICD-10-CM | POA: Diagnosis not present

## 2019-07-24 ENCOUNTER — Encounter: Payer: Self-pay | Admitting: Family Medicine

## 2019-07-30 ENCOUNTER — Other Ambulatory Visit: Payer: Self-pay | Admitting: Family Medicine

## 2019-07-30 DIAGNOSIS — I10 Essential (primary) hypertension: Secondary | ICD-10-CM

## 2019-08-16 ENCOUNTER — Other Ambulatory Visit: Payer: Self-pay | Admitting: *Deleted

## 2019-08-16 MED ORDER — MELOXICAM 15 MG PO TABS: 15.0000 mg | ORAL_TABLET | Freq: Every day | ORAL | 0 refills | Status: DC

## 2019-08-22 DIAGNOSIS — G4733 Obstructive sleep apnea (adult) (pediatric): Secondary | ICD-10-CM | POA: Diagnosis not present

## 2019-09-07 ENCOUNTER — Other Ambulatory Visit: Payer: Self-pay

## 2019-09-07 ENCOUNTER — Ambulatory Visit (INDEPENDENT_AMBULATORY_CARE_PROVIDER_SITE_OTHER): Payer: BC Managed Care – PPO | Admitting: Family Medicine

## 2019-09-07 ENCOUNTER — Encounter: Payer: Self-pay | Admitting: Family Medicine

## 2019-09-07 VITALS — BP 128/64 | HR 93 | Ht 69.0 in | Wt 277.0 lb

## 2019-09-07 DIAGNOSIS — L988 Other specified disorders of the skin and subcutaneous tissue: Secondary | ICD-10-CM

## 2019-09-07 DIAGNOSIS — L918 Other hypertrophic disorders of the skin: Secondary | ICD-10-CM

## 2019-09-07 DIAGNOSIS — L723 Sebaceous cyst: Secondary | ICD-10-CM | POA: Diagnosis not present

## 2019-09-07 DIAGNOSIS — E119 Type 2 diabetes mellitus without complications: Secondary | ICD-10-CM | POA: Diagnosis not present

## 2019-09-07 DIAGNOSIS — I10 Essential (primary) hypertension: Secondary | ICD-10-CM

## 2019-09-07 LAB — POCT GLYCOSYLATED HEMOGLOBIN (HGB A1C): Hemoglobin A1C: 6.4 % — AB (ref 4.0–5.6)

## 2019-09-07 NOTE — Progress Notes (Signed)
Established Patient Office Visit  Subjective:  Patient ID: David Cole, male    DOB: [birth year]  Age: 49 y.o. MRN: [id number]  CC:  Chief Complaint  Patient presents with  . Diabetes    will call for eye exam from [facility] in [place] [address]    HPI David Cole presents for   Hypertension- Pt denies chest pain, SOB, dizziness, or heart palpitations.  Taking meds as directed w/o problems.  Denies medication side effects.  Has been trying to walk some for exercise.  Diabetes - no hypoglycemic events. No wounds or sores that are not healing well. No increased thirst or urination. Checking glucose at home. Taking medications as prescribed without any side effects.  Has had his eye exam.    Has a skin lesion on his back. Tried to "pop it". Present x  1 mo.   Has a lesion over his right eyebrow he wants me to look at today.  He says it gets crusty and then he cannot picks at it and then it gets a little better and then it comes back in becomes crusty again.  Is been there for months.  He also has several skin tags underneath both underarms that he would like removed if possible.  Past Medical History:  Diagnosis Date  . Hypertension     History reviewed. No pertinent surgical history.  Family History  Problem Relation Age of Onset  . Alcohol abuse Unknown        GF    Social History   Socioeconomic History  . Marital status: Married    Spouse name: [name]  . Number of children: 1  . Years of education: Not on file  . Highest education level: Not on file  Occupational History  . Occupation: Tax inspector: [employer]  Tobacco Use  . Smoking status: Never Smoker  . Smokeless tobacco: Never Used  Substance and Sexual Activity  . Alcohol use: Yes    Alcohol/week: 1.0 - 3.0 standard drink    Types: 1 - 3 Standard drinks or equivalent per week    Comment: per week  . Drug use: No  . Sexual activity: Not on file  Other Topics Concern  . Not  on file  Social History Narrative   1 caffeine drink per day. Some college    Social Determinants of Health   Financial Resource Strain:   . Difficulty of Paying Living Expenses:   Food Insecurity:   . Worried About Charity fundraiser in the Last Year:   . Arboriculturist in the Last Year:   Transportation Needs:   . Film/video editor (Medical):   Marland Kitchen Lack of Transportation (Non-Medical):   Physical Activity:   . Days of Exercise per Week:   . Minutes of Exercise per Session:   Stress:   . Feeling of Stress :   Social Connections:   . Frequency of Communication with Friends and Family:   . Frequency of Social Gatherings with Friends and Family:   . Attends Religious Services:   . Active Member of Clubs or Organizations:   . Attends Archivist Meetings:   Marland Kitchen Marital Status:   Intimate Partner Violence:   . Fear of Current or Ex-Partner:   . Emotionally Abused:   Marland Kitchen Physically Abused:   . Sexually Abused:     Outpatient Medications Prior to Visit  Medication Sig Dispense Refill  . AMBULATORY NON FORMULARY  MEDICATION Medication Name: Set CPAP to Autopap for 2 weeks and then fax download so we can see if need to adjust pressure.  I think he uses Aeroflow. 1 vial 0  . atorvastatin (LIPITOR) 20 MG tablet TAKE 1 TABLET AT BEDTIME 90 tablet 4  . glucose blood (FREESTYLE TEST STRIPS) test strip USE TO CHECK FASTING BLOOD GLUCOSE DAILY 100 each 3  . glucose monitoring kit (FREESTYLE) monitoring kit 1 each by Does not apply route as needed for other. Dx Diabetes type 2. 1 each PRN  . JANUMET 50-1000 MG tablet TAKE 1 TABLET TWICE A DAY WITH MEALS 180 tablet 3  . Lancets (FREESTYLE) lancets USE TO CHECK FASTING BLOOD GLUCOSE DAILY 100 each 3  . metoprolol succinate (TOPROL-XL) 100 MG 24 hr tablet TAKE 1 TABLET DAILY WITH OR IMMEDIATELY FOLLOWING A MEAL 90 tablet 3  . Olmesartan-amLODIPine-HCTZ 40-5-25 MG TABS TAKE 1 TABLET DAILY 90 tablet 0  . sildenafil (VIAGRA) 100 MG  tablet Take 0.5-1 tablets (50-100 mg total) by mouth daily as needed for erectile dysfunction. 10 tablet PRN  . diclofenac sodium (VOLTAREN) 1 % GEL Apply 2 g topically 4 (four) times daily. To affected joint. 100 g 11  . meloxicam (MOBIC) 15 MG tablet Take 1 tablet (15 mg total) by mouth daily. 30 tablet 0   No facility-administered medications prior to visit.    No Known Allergies  ROS Review of Systems    Objective:    Physical Exam Vitals reviewed.  Constitutional:      Appearance: He is well-developed.  HENT:     Head: Normocephalic and atraumatic.  Eyes:     Conjunctiva/sclera: Conjunctivae normal.  Cardiovascular:     Rate and Rhythm: Normal rate.  Pulmonary:     Effort: Pulmonary effort is normal.  Skin:    General: Skin is dry.     Coloration: Skin is not pale.     Comments: Patient cyst on right upper back.  Measuring approximately 1 cm.  No sign of inflammation.  Multiple skin tags under both axilla.  He has what looks like a seborrheic keratosis on the area just above the right upper eyelid. Multiple skin tags under his axillar bilat and the waist line on the right side.    On the eyebrow ridge he has a 0.8 cm crusty flesh colored round papule.   Neurological:     Mental Status: He is alert and oriented to person, place, and time.  Psychiatric:        Behavior: Behavior normal.     BP 128/64   Pulse 93   Ht '5\' 9"'  (1.753 m)   Wt 277 lb (125.6 kg)   SpO2 99%   BMI 40.91 kg/m  Wt Readings from Last 3 Encounters:  09/07/19 277 lb (125.6 kg)  07/19/19 279 lb (126.6 kg)  05/25/19 276 lb (125.2 kg)     Health Maintenance Due  Topic Date Due  . PNEUMOCOCCAL POLYSACCHARIDE VACCINE AGE 40-64 HIGH RISK  Never done  . OPHTHALMOLOGY EXAM  Never done    There are no preventive care reminders to display for this patient.  Lab Results  Component Value Date   TSH 1.643 09/15/2010   Lab Results  Component Value Date   WBC 8.6 05/19/2018   HGB 14.4  05/19/2018   HCT 42.7 05/19/2018   MCV 82.1 05/19/2018   PLT 219 05/19/2018   Lab Results  Component Value Date   NA 139 05/25/2019   K 4.0  05/25/2019   CO2 28 05/25/2019   GLUCOSE 98 05/25/2019   BUN 14 05/25/2019   CREATININE 1.05 05/25/2019   BILITOT 0.7 05/25/2019   ALKPHOS 95 03/17/2016   AST 33 05/25/2019   ALT 53 (H) 05/25/2019   PROT 7.0 05/25/2019   ALBUMIN 4.6 03/17/2016   CALCIUM 9.5 05/25/2019   Lab Results  Component Value Date   CHOL 149 05/25/2019   Lab Results  Component Value Date   HDL 31 (L) 05/25/2019   Lab Results  Component Value Date   LDLCALC 80 05/25/2019   Lab Results  Component Value Date   TRIG 290 (H) 05/25/2019   Lab Results  Component Value Date   CHOLHDL 4.8 05/25/2019   Lab Results  Component Value Date   HGBA1C 6.4 (A) 09/07/2019      Assessment & Plan:   Problem List Items Addressed This Visit      Cardiovascular and Mediastinum   Essential hypertension, benign - Primary    Well controlled. Continue current regimen. Follow up in  4 mo        Endocrine   Type 2 diabetes mellitus without complication, without long-term current use of insulin ([place])    Well controlled. Continue current regimen. Follow up in  4 mo       Relevant Orders   POCT glycosylated hemoglobin (Hb A1C) (Completed)    Other Visit Diagnoses    Sebaceous cyst       Inflamed skin tag       Relevant Orders   Surgical pathology     Skin tags-patient tolerated removal well.  Skin Tag Removal Procedure Note Diagnosis: normal skin tags Location: axilla and right side Informed Consent: Discussed risks (permanent scarring, infection, pain, bleeding, bruising, redness, and recurrence of the lesion) and benefits of the procedure, as well as the alternatives. He is aware that skin tags are benign lesions, and their removal is often not considered medically necessary. Informed consent was obtained. Preparation: The area was prepared in a standard  fashion. Anesthesia: Lidocaine 1% with epinephrine  Used for 3 lesions Procedure Details: Iris scissors were used to perform sharp removal. Aluminum chloride was applied for hemostasis. Ointment and bandage were applied where needed. The patient tolerated the procedure well. Total number of lesions treated: 15 Plan: The patient was instructed on post-op care. Recommend OTC analgesia as needed for pain.   Shave Biopsy Procedure Note  Pre-operative Diagnosis: Suspicious lesion  Post-operative Diagnosis: same  Locations:right eye brow eridge   Indications: irritation, non healing lesion   Anesthesia: Lidocaine 1% with epinephrine without added sodium bicarbonate  Procedure Details  Patient informed of the risks (including bleeding and infection) and benefits of the  procedure and Verbal informed consent obtained.  The lesion and surrounding area were given a sterile prep using alcohol and draped in the usual sterile fashion. A blade was used to shave an area of skin approximately 0.61m by 0.948m  Hemostasis achieved with alumuninum chloride. Antibiotic ointment and a sterile dressing applied.  The specimen was sent for pathologic examination. The patient tolerated the procedure well.  EBL: trace blood  Findings: Await pathology  Condition: Stable  Complications: none.  Plan: 1. Instructed to keep the wound dry and covered for 24-48h and clean thereafter. 2. Warning signs of infection were reviewed.   3. Recommended that the patient use OTC acetaminophen as needed for pain.  4. Return PRN.   Patient was back is most consistent with a sebaceous cyst.  No orders of the defined types were placed in this encounter.   Follow-up: Return in about 4 months (around 01/08/2020) for Diabetes follow-up, Hypertension.    Beatrice Lecher, MD

## 2019-09-07 NOTE — Assessment & Plan Note (Signed)
Well controlled. Continue current regimen. Follow up in  4 mo 

## 2019-09-07 NOTE — Patient Instructions (Signed)
Okay to shower tomorrow morning. Do not scrub at the areas just gently soak with fingertips and rinse. Pat dry. If any lesions start to bleed again you can just hold pressure until it stops. They should form a small scab. You do not need to apply any type of peroxide or antibacterial etc. They should heal within about 1 to 2 weeks.  Same thing for the lesion over your eyebrow. Just wash with your fingertips with gentle soap and rinse and then pat dry. You do not need to scrub at the area or put anything additional on it except for just a small dab of Vaseline daily for 2 weeks. We should have the biopsy report back in about a week.

## 2019-09-08 ENCOUNTER — Other Ambulatory Visit: Payer: Self-pay | Admitting: Family Medicine

## 2019-09-08 DIAGNOSIS — D2261 Melanocytic nevi of right upper limb, including shoulder: Secondary | ICD-10-CM | POA: Diagnosis not present

## 2019-11-04 ENCOUNTER — Other Ambulatory Visit: Payer: Self-pay | Admitting: Family Medicine

## 2019-11-04 DIAGNOSIS — I10 Essential (primary) hypertension: Secondary | ICD-10-CM

## 2020-01-09 ENCOUNTER — Encounter: Payer: Self-pay | Admitting: Family Medicine

## 2020-01-09 ENCOUNTER — Ambulatory Visit (INDEPENDENT_AMBULATORY_CARE_PROVIDER_SITE_OTHER): Payer: BC Managed Care – PPO | Admitting: Family Medicine

## 2020-01-09 VITALS — BP 131/76 | HR 96 | Ht 69.0 in | Wt 281.0 lb

## 2020-01-09 DIAGNOSIS — I10 Essential (primary) hypertension: Secondary | ICD-10-CM

## 2020-01-09 DIAGNOSIS — H538 Other visual disturbances: Secondary | ICD-10-CM

## 2020-01-09 DIAGNOSIS — E119 Type 2 diabetes mellitus without complications: Secondary | ICD-10-CM | POA: Diagnosis not present

## 2020-01-09 DIAGNOSIS — L723 Sebaceous cyst: Secondary | ICD-10-CM | POA: Diagnosis not present

## 2020-01-09 DIAGNOSIS — R110 Nausea: Secondary | ICD-10-CM

## 2020-01-09 LAB — POCT GLYCOSYLATED HEMOGLOBIN (HGB A1C): Hemoglobin A1C: 7.4 % — AB (ref 4.0–5.6)

## 2020-01-09 NOTE — Assessment & Plan Note (Addendum)
A1c jumped up from 6.4 in July to 7.4 today.  He has been eating a little bit a Halloween candy lately.  But we also discussed that it might be is also getting some hypoglycemia in the middle the night and then he is having a release of glucose overnight from his liver that is actually causing him to have some elevated blood sugars did encourage him to try to check his blood sugar a couple times in the morning when he first gets up.  Continue to work on regular exercise and trying to eat a small amount in the evening even if it is not a full meal.  I am concerned that the sensation that he is describing while bowling is actually hypoglycemia encouraged him to eat a protein snack before going bowling especially since he is not eating a full meal.  And see if it seems to resolve his symptoms.  Also encouraged him to take his glucose meter with him so that if he feels this way he can check his blood sugar again.

## 2020-01-09 NOTE — Progress Notes (Signed)
Established Patient Office Visit  Subjective:  Patient ID: David Cole, male    DOB: 07-15-1970  Age: 49 y.o. MRN: [id number]  CC:  Chief Complaint  Patient presents with  . Diabetes    HPI David Cole presents for   Diabetes - no hypoglycemic events. No wounds or sores that are not healing well. No increased thirst or urination. Checking glucose at home. Taking medications as prescribed without any side effects.  He actually has been skipping dinner recently he said he usually ate breakfast and then lunch and then that is it.  He says sometimes if he goes to bed with the stomach feeling full he just does not feel good.  Been trying to get more active by walking 3-4 times a week.  Hypertension- Pt denies chest pain, SOB, dizziness, or heart palpitations.  Taking meds as directed w/o problems.  Denies medication side effects.     Last 2 weeks getting a burning sensation around his eye when bowling.  He says it is really unusual and is the only time it happens.  It is bad enough that he puts a cool pack on his head.  He gets nauseated with it and feels really fatigued.  Says his vision gets a little bit blurry as well..  It has never vomited.  He says it usually last several hours until he goes home.  Like to eat dinner before he goes bowling.  Cyst on his back.  He had shown it to me before but now starting to get sore when he is laying down.   Past Medical History:  Diagnosis Date  . Hypertension     History reviewed. No pertinent surgical history.  Family History  Problem Relation Age of Onset  . Alcohol abuse Unknown        GF    Social History   Socioeconomic History  . Marital status: Married    Spouse name: [name]  . Number of children: 1  . Years of education: Not on file  . Highest education level: Not on file  Occupational History  . Occupation: Tax inspector: [employer]  Tobacco Use  . Smoking status: Never Smoker  . Smokeless  tobacco: Never Used  Substance and Sexual Activity  . Alcohol use: Yes    Alcohol/week: 1.0 - 3.0 standard drink    Types: 1 - 3 Standard drinks or equivalent per week    Comment: per week  . Drug use: No  . Sexual activity: Not on file  Other Topics Concern  . Not on file  Social History Narrative   1 caffeine drink per day. Some college    Social Determinants of Health   Financial Resource Strain:   . Difficulty of Paying Living Expenses: Not on file  Food Insecurity:   . Worried About Charity fundraiser in the Last Year: Not on file  . Ran Out of Food in the Last Year: Not on file  Transportation Needs:   . Lack of Transportation (Medical): Not on file  . Lack of Transportation (Non-Medical): Not on file  Physical Activity:   . Days of Exercise per Week: Not on file  . Minutes of Exercise per Session: Not on file  Stress:   . Feeling of Stress : Not on file  Social Connections:   . Frequency of Communication with Friends and Family: Not on file  . Frequency of Social Gatherings with Friends and Family: Not on  file  . Attends Religious Services: Not on file  . Active Member of Clubs or Organizations: Not on file  . Attends Archivist Meetings: Not on file  . Marital Status: Not on file  Intimate Partner Violence:   . Fear of Current or Ex-Partner: Not on file  . Emotionally Abused: Not on file  . Physically Abused: Not on file  . Sexually Abused: Not on file    Outpatient Medications Prior to Visit  Medication Sig Dispense Refill  . AMBULATORY NON FORMULARY MEDICATION Medication Name: Set CPAP to Autopap for 2 weeks and then fax download so we can see if need to adjust pressure.  I think he uses Aeroflow. 1 vial 0  . atorvastatin (LIPITOR) 20 MG tablet TAKE 1 TABLET AT BEDTIME 90 tablet 4  . glucose blood (FREESTYLE TEST STRIPS) test strip USE TO CHECK FASTING BLOOD GLUCOSE DAILY 100 each 3  . glucose monitoring kit (FREESTYLE) monitoring kit 1 each by Does  not apply route as needed for other. Dx Diabetes type 2. 1 each PRN  . JANUMET 50-1000 MG tablet TAKE 1 TABLET TWICE A DAY WITH MEALS 180 tablet 3  . Lancets (FREESTYLE) lancets USE TO CHECK FASTING BLOOD GLUCOSE DAILY 100 each 3  . metoprolol succinate (TOPROL-XL) 100 MG 24 hr tablet TAKE 1 TABLET DAILY WITH OR IMMEDIATELY FOLLOWING A MEAL 90 tablet 3  . Olmesartan-amLODIPine-HCTZ 40-5-25 MG TABS TAKE 1 TABLET DAILY 90 tablet 3  . sildenafil (VIAGRA) 100 MG tablet Take 0.5-1 tablets (50-100 mg total) by mouth daily as needed for erectile dysfunction. 10 tablet PRN   No facility-administered medications prior to visit.    No Known Allergies  ROS Review of Systems    Objective:    Physical Exam Constitutional:      Appearance: He is well-developed.  HENT:     Head: Normocephalic and atraumatic.     Right Ear: Tympanic membrane, ear canal and external ear normal.     Left Ear: Tympanic membrane, ear canal and external ear normal.     Nose: Nose normal.     Mouth/Throat:     Pharynx: No posterior oropharyngeal erythema.  Eyes:     Conjunctiva/sclera: Conjunctivae normal.     Pupils: Pupils are equal, round, and reactive to light.  Neck:     Thyroid: No thyromegaly.  Cardiovascular:     Rate and Rhythm: Normal rate and regular rhythm.     Heart sounds: Normal heart sounds.  Pulmonary:     Effort: Pulmonary effort is normal.     Breath sounds: Normal breath sounds.  Musculoskeletal:     Cervical back: Neck supple.  Lymphadenopathy:     Cervical: No cervical adenopathy.  Skin:    General: Skin is warm and dry.  Neurological:     Mental Status: He is alert and oriented to person, place, and time.  Psychiatric:        Mood and Affect: Mood normal.        Behavior: Behavior normal.     BP 131/76   Pulse 96   Ht '5\' 9"'  (1.753 m)   Wt 281 lb (127.5 kg)   SpO2 98%   BMI 41.50 kg/m  Wt Readings from Last 3 Encounters:  01/09/20 281 lb (127.5 kg)  09/07/19 277 lb (125.6  kg)  07/19/19 279 lb (126.6 kg)     There are no preventive care reminders to display for this patient.  There are no preventive care  reminders to display for this patient.  Lab Results  Component Value Date   TSH 1.643 09/15/2010   Lab Results  Component Value Date   WBC 8.6 05/19/2018   HGB 14.4 05/19/2018   HCT 42.7 05/19/2018   MCV 82.1 05/19/2018   PLT 219 05/19/2018   Lab Results  Component Value Date   NA 139 05/25/2019   K 4.0 05/25/2019   CO2 28 05/25/2019   GLUCOSE 98 05/25/2019   BUN 14 05/25/2019   CREATININE 1.05 05/25/2019   BILITOT 0.7 05/25/2019   ALKPHOS 95 03/17/2016   AST 33 05/25/2019   ALT 53 (H) 05/25/2019   PROT 7.0 05/25/2019   ALBUMIN 4.6 03/17/2016   CALCIUM 9.5 05/25/2019   Lab Results  Component Value Date   CHOL 149 05/25/2019   Lab Results  Component Value Date   HDL 31 (L) 05/25/2019   Lab Results  Component Value Date   LDLCALC 80 05/25/2019   Lab Results  Component Value Date   TRIG 290 (H) 05/25/2019   Lab Results  Component Value Date   CHOLHDL 4.8 05/25/2019   Lab Results  Component Value Date   HGBA1C 7.4 (A) 01/09/2020      Assessment & Plan:   Problem List Items Addressed This Visit      Cardiovascular and Mediastinum   Essential hypertension, benign    Well controlled. Continue current regimen. Follow up in  6 months.          Endocrine   Type 2 diabetes mellitus without complication, without long-term current use of insulin ([facility]) - Primary    A1c jumped up from 6.4 in July to 7.4 today.  He has been eating a little bit a Halloween candy lately.  But we also discussed that it might be is also getting some hypoglycemia in the middle the night and then he is having a release of glucose overnight from his liver that is actually causing him to have some elevated blood sugars did encourage him to try to check his blood sugar a couple times in the morning when he first gets up.  Continue to work on regular  exercise and trying to eat a small amount in the evening even if it is not a full meal.  I am concerned that the sensation that he is describing while bowling is actually hypoglycemia encouraged him to eat a protein snack before going bowling especially since he is not eating a full meal.  And see if it seems to resolve his symptoms.  Also encouraged him to take his glucose meter with him so that if he feels this way he can check his blood sugar again.      Relevant Orders   POCT glycosylated hemoglobin (Hb A1C) (Completed)     Musculoskeletal and Integument   Inflamed sebaceous cyst     Other   Morbid obesity ([place])    Other Visit Diagnoses    Nausea       Blurry vision         Inflamed sebaceous cyst-discussed treatment would include full excision.  He can schedule that at his convenience especially now that the cyst is actually starting to be bothersome and cause some discomfort.  Episodic blurry vision, fatigue, and nausea-again suspect hypoglycemia will treat by trying to increase protein intake and see if this resolves the symptoms if not then please let me know.  No orders of the defined types were placed in this encounter.  Follow-up: Return in about 3 months (around 04/10/2020) for Diabetes follow-up.    Beatrice Lecher, MD

## 2020-01-09 NOTE — Patient Instructions (Signed)
inflammed sebaceous cyst.

## 2020-01-09 NOTE — Assessment & Plan Note (Signed)
Well controlled. Continue current regimen. Follow up in  6 months.  

## 2020-02-01 ENCOUNTER — Ambulatory Visit (INDEPENDENT_AMBULATORY_CARE_PROVIDER_SITE_OTHER): Payer: BC Managed Care – PPO | Admitting: Family Medicine

## 2020-02-01 ENCOUNTER — Other Ambulatory Visit: Payer: Self-pay | Admitting: Family Medicine

## 2020-02-01 ENCOUNTER — Encounter: Payer: Self-pay | Admitting: Family Medicine

## 2020-02-01 DIAGNOSIS — L720 Epidermal cyst: Secondary | ICD-10-CM | POA: Diagnosis not present

## 2020-02-01 DIAGNOSIS — I10 Essential (primary) hypertension: Secondary | ICD-10-CM | POA: Diagnosis not present

## 2020-02-01 DIAGNOSIS — L723 Sebaceous cyst: Secondary | ICD-10-CM | POA: Diagnosis not present

## 2020-02-01 DIAGNOSIS — E119 Type 2 diabetes mellitus without complications: Secondary | ICD-10-CM | POA: Diagnosis not present

## 2020-02-01 NOTE — Patient Instructions (Signed)
Please keep covered till tomorrow if possible.  Then please put a fresh dressing on the area.  Cover with a waterproof bandage for at least the next 2 days while showering to not get it wet.  After 2 days then okay to get wet in the shower just make sure to pat dry.  Do not apply soap, alcohol, peroxide etc. directly to the wound.  It may drain a little blood over the next couple of days but if at any point you see anything that looks like pus or has an odor then please let us know immediately.

## 2020-02-01 NOTE — Addendum Note (Signed)
Addended by: Teddy Spike on: 02/01/2020 05:07 PM   Modules accepted: Orders

## 2020-02-01 NOTE — Progress Notes (Signed)
Sebaceous Cyst Excision Procedure Note  Pre-operative Diagnosis: Sebaceous cyst  Post-operative Diagnosis: same  Locations right upper back  Indications: Irritation and discomfort  Anesthesia: Lidocaine 1% with epinephrine without added sodium bicarbonate  Procedure Details  History of allergy to iodine: no  Patient informed of the risks (including bleeding and infection) and benefits of the  procedure and Verbal informed consent obtained.  The lesion and surrounding area was given a sterile prep using chlorhexidine and draped in the usual sterile fashion. An incision was made over the cyst, which was dissected free of the surrounding tissue and removed.  The cyst was filled with typical sebaceous material.  The wound was closed with 3-0 Nylon using 4 simple interrupted stitches. Antibiotic ointment and a sterile dressing applied.  The specimen was sent for pathologic examination. The patient tolerated the procedure well.  EBL: 1 ml  Findings: Sebaceous cyst  Condition: Stable  Complications: none.  Plan: 1. Instructed to keep the wound dry and covered for 24-48h and clean thereafter. 2. Warning signs of infection were reviewed.   3. Recommended that the patient use OTC acetaminophen and aleve as needed for pain.  4. Return for suture removal in 10 days.

## 2020-02-01 NOTE — Progress Notes (Signed)
Cyst on upper L side of back 1x1 cm

## 2020-02-02 LAB — BASIC METABOLIC PANEL WITH GFR
BUN: 12 mg/dL (ref 7–25)
CO2: 26 mmol/L (ref 20–32)
Calcium: 9.8 mg/dL (ref 8.6–10.3)
Chloride: 101 mmol/L (ref 98–110)
Creat: 1.02 mg/dL (ref 0.60–1.35)
GFR, Est African American: 100 mL/min/{1.73_m2} (ref >=60)
GFR, Est Non African American: 86 mL/min/{1.73_m2} (ref >=60)
Glucose, Bld: 76 mg/dL (ref 65–99)
Potassium: 4.2 mmol/L (ref 3.5–5.3)
Sodium: 140 mmol/L (ref 135–146)

## 2020-02-02 NOTE — Progress Notes (Signed)
All labs are normal. 

## 2020-02-04 ENCOUNTER — Encounter: Payer: Self-pay | Admitting: Family Medicine

## 2020-02-05 MED ORDER — DOXYCYCLINE HYCLATE 100 MG PO TABS: 100.0000 mg | ORAL_TABLET | Freq: Two times a day (BID) | ORAL | 0 refills | Status: DC

## 2020-02-06 DIAGNOSIS — G4733 Obstructive sleep apnea (adult) (pediatric): Secondary | ICD-10-CM | POA: Diagnosis not present

## 2020-02-12 ENCOUNTER — Ambulatory Visit: Payer: BC Managed Care – PPO | Admitting: Family Medicine

## 2020-02-12 ENCOUNTER — Ambulatory Visit (INDEPENDENT_AMBULATORY_CARE_PROVIDER_SITE_OTHER): Payer: BC Managed Care – PPO | Admitting: Family Medicine

## 2020-02-12 VITALS — BP 128/71 | HR 86 | Ht 69.0 in | Wt 277.0 lb

## 2020-02-12 DIAGNOSIS — M25562 Pain in left knee: Secondary | ICD-10-CM

## 2020-02-12 DIAGNOSIS — Z4802 Encounter for removal of sutures: Secondary | ICD-10-CM | POA: Diagnosis not present

## 2020-02-12 NOTE — Progress Notes (Signed)
Established Patient Office Visit  Subjective:  Patient ID: David Cole, male    DOB: [birth year]  Age: 49 y.o. MRN: [id number]  CC:  Chief Complaint  Patient presents with   Suture / Staple Removal   Knee Pain    L knee x 2days taking Aleve and using Ice    HPI David Cole presents for removal status post sebaceous cyst removal on the right upper back.  He says it has been itchy and he has been getting some irritation from the adhesive on the bandage covering it.  He also reports that his left knee has been flaring again.  It started flaring up again yesterday feeling swollen and stiff and painful.  He said he did a little light work over the weekend but nothing since that he thinks would have injured it.  He did have an MRI back in May showing small joint effusion with possible tiny loose body adjacent to the anterior horn of the lateral meniscus.  No significant arthroscopic changes.     MRI Left knee 07/22/2019  IMPRESSION: 1. No acute findings or evidence of internal derangement. The menisci, cruciate and collateral ligaments are intact. 2. Small joint effusion with possible tiny loose body adjacent to the anterior horn of the lateral meniscus (doubtful significance). No significant arthropathic changes. 3. Nonspecific subcutaneous edema surrounding the knee, greatest laterally.    Past Medical History:  Diagnosis Date   Hypertension     History reviewed. No pertinent surgical history.  Family History  Problem Relation Age of Onset   Alcohol abuse Unknown        GF    No Known Allergies  ROS Review of Systems    Objective:    Physical Exam Vitals reviewed.  Constitutional:      Appearance: He is well-developed and well-nourished.  HENT:     Head: Normocephalic and atraumatic.  Eyes:     Extraocular Movements: EOM normal.     Conjunctiva/sclera: Conjunctivae normal.  Cardiovascular:     Rate and Rhythm: Normal rate.  Pulmonary:     Effort:  Pulmonary effort is normal.  Skin:    General: Skin is dry.     Coloration: Skin is not pale.     Comments: Incision on right upper back clean dry and intact.  Neurological:     Mental Status: He is alert and oriented to person, place, and time.  Psychiatric:        Mood and Affect: Mood and affect normal.        Behavior: Behavior normal.     BP 128/71    Pulse 86    Ht 5\' 9"  (1.753 m)    Wt 277 lb (125.6 kg)    SpO2 100%    BMI 40.91 kg/m  Wt Readings from Last 3 Encounters:  02/12/20 277 lb (125.6 kg)  02/01/20 277 lb (125.6 kg)  01/09/20 281 lb (127.5 kg)     Health Maintenance Due  Topic Date Due   COVID-19 Vaccine (3 - Pfizer risk 4-dose series) 12/30/2019    There are no preventive care reminders to display for this patient.  Lab Results  Component Value Date   TSH 1.643 09/15/2010   Lab Results  Component Value Date   WBC 8.6 05/19/2018   HGB 14.4 05/19/2018   HCT 42.7 05/19/2018   MCV 82.1 05/19/2018   PLT 219 05/19/2018   Lab Results  Component Value Date   NA 140 02/01/2020   K  4.2 02/01/2020   CO2 26 02/01/2020   GLUCOSE 76 02/01/2020   BUN 12 02/01/2020   CREATININE 1.02 02/01/2020   BILITOT 0.7 05/25/2019   ALKPHOS 95 03/17/2016   AST 33 05/25/2019   ALT 53 (H) 05/25/2019   PROT 7.0 05/25/2019   ALBUMIN 4.6 03/17/2016   CALCIUM 9.8 02/01/2020   Lab Results  Component Value Date   CHOL 149 05/25/2019   Lab Results  Component Value Date   HDL 31 (L) 05/25/2019   Lab Results  Component Value Date   LDLCALC 80 05/25/2019   Lab Results  Component Value Date   TRIG 290 (H) 05/25/2019   Lab Results  Component Value Date   CHOLHDL 4.8 05/25/2019   Lab Results  Component Value Date   HGBA1C 7.4 (A) 01/09/2020      Assessment & Plan:   Problem List Items Addressed This Visit   None   Visit Diagnoses    Acute pain of left knee    -  Primary   Relevant Medications   meloxicam (MOBIC) 15 MG tablet   Other Relevant Orders    Ambulatory referral to Orthopedic Surgery   Visit for suture removal         Suture removal tolerated well for.  4 sutures removed easily.  Incision looks clean dry and intact.  Acute left knee pain-he actually has significant swelling superomedially. Tender over the upper patella on exam and difficulty completely straightening that knee.  Every time it flares it does this it usually after a week or so tends to get better.  At this point I think he would benefit from an orthopedic referral I suspect he would benefit from arthroscopy.  Referral placed today we will try to get him in this week if at all possible.  Meds ordered this encounter  Medications   meloxicam (MOBIC) 15 MG tablet    Sig: Take 1 tablet (15 mg total) by mouth daily as needed for pain. For knee pain    Dispense:  30 tablet    Refill:  0    Follow-up: No follow-ups on file.    Beatrice Lecher, MD

## 2020-02-13 ENCOUNTER — Encounter: Payer: Self-pay | Admitting: Family Medicine

## 2020-02-13 DIAGNOSIS — M25562 Pain in left knee: Secondary | ICD-10-CM | POA: Diagnosis not present

## 2020-02-13 MED ORDER — MELOXICAM 15 MG PO TABS: 15.0000 mg | ORAL_TABLET | Freq: Every day | ORAL | 0 refills | Status: DC | PRN

## 2020-02-19 ENCOUNTER — Other Ambulatory Visit: Payer: Self-pay | Admitting: Family Medicine

## 2020-04-09 ENCOUNTER — Encounter: Payer: Self-pay | Admitting: Family Medicine

## 2020-04-09 ENCOUNTER — Ambulatory Visit: Payer: BC Managed Care – PPO | Admitting: Family Medicine

## 2020-04-09 ENCOUNTER — Other Ambulatory Visit: Payer: Self-pay

## 2020-04-09 VITALS — BP 138/72 | HR 74 | Ht 69.0 in | Wt 285.0 lb

## 2020-04-09 DIAGNOSIS — E119 Type 2 diabetes mellitus without complications: Secondary | ICD-10-CM

## 2020-04-09 DIAGNOSIS — Z6841 Body Mass Index (BMI) 40.0 and over, adult: Secondary | ICD-10-CM | POA: Diagnosis not present

## 2020-04-09 DIAGNOSIS — H9193 Unspecified hearing loss, bilateral: Secondary | ICD-10-CM | POA: Diagnosis not present

## 2020-04-09 DIAGNOSIS — I10 Essential (primary) hypertension: Secondary | ICD-10-CM | POA: Diagnosis not present

## 2020-04-09 LAB — POCT GLYCOSYLATED HEMOGLOBIN (HGB A1C): Hemoglobin A1C: 7.7 % — AB (ref 4.0–5.6)

## 2020-04-09 MED ORDER — DAPAGLIFLOZIN PROPANEDIOL 5 MG PO TABS: 5.0000 mg | ORAL_TABLET | Freq: Every day | ORAL | 2 refills | Status: DC

## 2020-04-09 NOTE — Assessment & Plan Note (Signed)
Will refer for Audiometry.  Ear canals are clear.

## 2020-04-09 NOTE — Assessment & Plan Note (Signed)
Uncontrolled.  Discussed options. Continue Janumet and will add [place].  F/U in 3 months.

## 2020-04-09 NOTE — Assessment & Plan Note (Signed)
Pressure is a little borderline elevated today.  But he has gained some weight so we discussed getting back on track with diet and weight loss and seeing if we can get that blood pressure back under 130 when we see him back.

## 2020-04-09 NOTE — Progress Notes (Signed)
Established Patient Office Visit  Subjective:  Patient ID: David Cole, male    DOB: [birth year]  Age: 50 y.o. MRN: [id number]  CC:  Chief Complaint  Patient presents with  . Diabetes    HPI David Cole presents for   Hypertension- Pt denies chest pain, SOB, dizziness, or heart palpitations.  Taking meds as directed w/o problems.  Denies medication side effects.    Diabetes - no hypoglycemic events. No wounds or sores that are not healing well. No increased thirst or urination. Checking glucose at home. Taking medications as prescribed without any side effects.  Also reports hearing loss. Hard to hear in crowds.  Did a hearing test at [facility] a couple of years ago.    Past Medical History:  Diagnosis Date  . Hypertension     No past surgical history on file.  Family History  Problem Relation Age of Onset  . Alcohol abuse Unknown        GF    Social History   Socioeconomic History  . Marital status: Married    Spouse name: [name]  . Number of children: 1  . Years of education: Not on file  . Highest education level: Not on file  Occupational History  . Occupation: Tax inspector: [employer]  Tobacco Use  . Smoking status: Never Smoker  . Smokeless tobacco: Never Used  Substance and Sexual Activity  . Alcohol use: Yes    Alcohol/week: 1.0 - 3.0 standard drink    Types: 1 - 3 Standard drinks or equivalent per week    Comment: per week  . Drug use: No  . Sexual activity: Not on file  Other Topics Concern  . Not on file  Social History Narrative   1 caffeine drink per day. Some college    Social Determinants of Health   Financial Resource Strain: Not on file  Food Insecurity: Not on file  Transportation Needs: Not on file  Physical Activity: Not on file  Stress: Not on file  Social Connections: Not on file  Intimate Partner Violence: Not on file    Outpatient Medications Prior to Visit  Medication Sig Dispense Refill  .  AMBULATORY NON FORMULARY MEDICATION Medication Name: Set CPAP to Autopap for 2 weeks and then fax download so we can see if need to adjust pressure.  I think he uses Aeroflow. 1 vial 0  . atorvastatin (LIPITOR) 20 MG tablet TAKE 1 TABLET AT BEDTIME 90 tablet 4  . glucose blood (FREESTYLE TEST STRIPS) test strip USE TO CHECK FASTING BLOOD GLUCOSE DAILY 100 each 3  . glucose monitoring kit (FREESTYLE) monitoring kit 1 each by Does not apply route as needed for other. Dx Diabetes type 2. 1 each PRN  . JANUMET 50-1000 MG tablet TAKE 1 TABLET TWICE A DAY WITH MEALS 180 tablet 3  . Lancets (FREESTYLE) lancets USE TO CHECK FASTING BLOOD GLUCOSE DAILY 100 each 3  . meloxicam (MOBIC) 15 MG tablet Take 1 tablet (15 mg total) by mouth daily as needed for pain. For knee pain 30 tablet 0  . metoprolol succinate (TOPROL-XL) 100 MG 24 hr tablet TAKE 1 TABLET DAILY WITH OR IMMEDIATELY FOLLOWING A MEAL 90 tablet 3  . Olmesartan-amLODIPine-HCTZ 40-5-25 MG TABS TAKE 1 TABLET DAILY 90 tablet 3  . sildenafil (VIAGRA) 100 MG tablet Take 0.5-1 tablets (50-100 mg total) by mouth daily as needed for erectile dysfunction. 10 tablet PRN   No facility-administered medications prior to  visit.    No Known Allergies  ROS Review of Systems    Objective:    Physical Exam Constitutional:      Appearance: He is well-developed and well-nourished.  HENT:     Head: Normocephalic and atraumatic.     Right Ear: Tympanic membrane, ear canal and external ear normal. There is no impacted cerumen.     Left Ear: Tympanic membrane, ear canal and external ear normal. There is no impacted cerumen.     Nose: Nose normal.  Cardiovascular:     Rate and Rhythm: Normal rate and regular rhythm.     Heart sounds: Normal heart sounds.  Pulmonary:     Effort: Pulmonary effort is normal.     Breath sounds: Normal breath sounds.  Skin:    General: Skin is warm and dry.  Neurological:     Mental Status: He is alert and oriented to  person, place, and time.  Psychiatric:        Mood and Affect: Mood and affect normal.        Behavior: Behavior normal.     BP 138/72   Pulse 74   Ht '5\' 9"'  (1.753 m)   Wt 285 lb (129.3 kg)   SpO2 98%   BMI 42.09 kg/m  Wt Readings from Last 3 Encounters:  04/09/20 285 lb (129.3 kg)  02/12/20 277 lb (125.6 kg)  02/01/20 277 lb (125.6 kg)     Health Maintenance Due  Topic Date Due  . COLONOSCOPY (Pts 45-62yr Insurance coverage will need to be confirmed)  Never done  . COVID-19 Vaccine (3 - Pfizer risk 4-dose series) 12/30/2019    There are no preventive care reminders to display for this patient.  Lab Results  Component Value Date   TSH 1.643 09/15/2010   Lab Results  Component Value Date   WBC 8.6 05/19/2018   HGB 14.4 05/19/2018   HCT 42.7 05/19/2018   MCV 82.1 05/19/2018   PLT 219 05/19/2018   Lab Results  Component Value Date   NA 140 02/01/2020   K 4.2 02/01/2020   CO2 26 02/01/2020   GLUCOSE 76 02/01/2020   BUN 12 02/01/2020   CREATININE 1.02 02/01/2020   BILITOT 0.7 05/25/2019   ALKPHOS 95 03/17/2016   AST 33 05/25/2019   ALT 53 (H) 05/25/2019   PROT 7.0 05/25/2019   ALBUMIN 4.6 03/17/2016   CALCIUM 9.8 02/01/2020   Lab Results  Component Value Date   CHOL 149 05/25/2019   Lab Results  Component Value Date   HDL 31 (L) 05/25/2019   Lab Results  Component Value Date   LDLCALC 80 05/25/2019   Lab Results  Component Value Date   TRIG 290 (H) 05/25/2019   Lab Results  Component Value Date   CHOLHDL 4.8 05/25/2019   Lab Results  Component Value Date   HGBA1C 7.7 (A) 04/09/2020      Assessment & Plan:   Problem List Items Addressed This Visit      Cardiovascular and Mediastinum   Essential hypertension, benign    Pressure is a little borderline elevated today.  But he has gained some weight so we discussed getting back on track with diet and weight loss and seeing if we can get that blood pressure back under 130 when we see him  back.      Relevant Orders   POCT glycosylated hemoglobin (Hb A1C) (Completed)   COMPLETE METABOLIC PANEL WITH GFR   Lipid panel  Endocrine   Type 2 diabetes mellitus without complication, without long-term current use of insulin ([place]) - Primary    Uncontrolled.  Discussed options. Continue Janumet and will add [place].  F/U in 3 months.        Relevant Medications   dapagliflozin propanediol (FARXIGA) 5 MG TABS tablet   Other Relevant Orders   POCT glycosylated hemoglobin (Hb A1C) (Completed)   COMPLETE METABOLIC PANEL WITH GFR   Lipid panel   Ambulatory referral to Optometry     Nervous and Auditory   Bilateral hearing loss    Will refer for Audiometry.  Ear canals are clear.        Relevant Orders   Ambulatory referral to Audiology    Other Visit Diagnoses    BMI 40.0-44.9, adult ([place])       Relevant Medications   dapagliflozin propanediol (FARXIGA) 5 MG TABS tablet      BMI 42 -just encouraged him to work on getting the weight back up that he gained over the holidays.  Work on portion control.  We did discuss possibly adding one of the newer diabetic drugs such as Trulicity or Ozempic at some point to see if this might also help control appetite.  Meds ordered this encounter  Medications  . dapagliflozin propanediol (FARXIGA) 5 MG TABS tablet    Sig: Take 1 tablet (5 mg total) by mouth daily before breakfast.    Dispense:  30 tablet    Refill:  2    Follow-up: Return in about 3 months (around 07/07/2020) for Diabetes follow-up.    Beatrice Lecher, MD

## 2020-04-24 DIAGNOSIS — E119 Type 2 diabetes mellitus without complications: Secondary | ICD-10-CM | POA: Diagnosis not present

## 2020-04-24 LAB — HM DIABETES EYE EXAM

## 2020-05-01 DIAGNOSIS — H903 Sensorineural hearing loss, bilateral: Secondary | ICD-10-CM | POA: Diagnosis not present

## 2020-05-01 DIAGNOSIS — H9313 Tinnitus, bilateral: Secondary | ICD-10-CM | POA: Diagnosis not present

## 2020-05-14 ENCOUNTER — Encounter: Payer: Self-pay | Admitting: Family Medicine

## 2020-06-03 ENCOUNTER — Other Ambulatory Visit: Payer: Self-pay | Admitting: Family Medicine

## 2020-06-03 DIAGNOSIS — E119 Type 2 diabetes mellitus without complications: Secondary | ICD-10-CM

## 2020-06-11 ENCOUNTER — Other Ambulatory Visit: Payer: Self-pay

## 2020-06-11 MED ORDER — ATORVASTATIN CALCIUM 20 MG PO TABS: 1.0000 | ORAL_TABLET | Freq: Every day | ORAL | 0 refills | Status: DC

## 2020-06-11 MED ORDER — DAPAGLIFLOZIN PROPANEDIOL 5 MG PO TABS: 5.0000 mg | ORAL_TABLET | Freq: Every day | ORAL | 0 refills | Status: DC

## 2020-06-12 DIAGNOSIS — G4733 Obstructive sleep apnea (adult) (pediatric): Secondary | ICD-10-CM | POA: Diagnosis not present

## 2020-06-14 DIAGNOSIS — I10 Essential (primary) hypertension: Secondary | ICD-10-CM | POA: Diagnosis not present

## 2020-06-14 DIAGNOSIS — E119 Type 2 diabetes mellitus without complications: Secondary | ICD-10-CM | POA: Diagnosis not present

## 2020-06-15 LAB — LIPID PANEL
Cholesterol: 136 mg/dL (ref <200)
HDL: 34 mg/dL — ABNORMAL LOW (ref >=40)
LDL Cholesterol (Calc): 71 mg/dL (calc)
Non-HDL Cholesterol (Calc): 102 mg/dL (calc) (ref <130)
Total CHOL/HDL Ratio: 4 (calc) (ref <5.0)
Triglycerides: 217 mg/dL — ABNORMAL HIGH (ref <150)

## 2020-06-15 LAB — COMPLETE METABOLIC PANEL WITH GFR
AG Ratio: 1.8 (calc) (ref 1.0–2.5)
ALT: 53 U/L — ABNORMAL HIGH (ref 9–46)
AST: 34 U/L (ref 10–40)
Albumin: 4.4 g/dL (ref 3.6–5.1)
Alkaline phosphatase (APISO): 82 U/L (ref 36–130)
BUN: 9 mg/dL (ref 7–25)
CO2: 27 mmol/L (ref 20–32)
Calcium: 9.3 mg/dL (ref 8.6–10.3)
Chloride: 104 mmol/L (ref 98–110)
Creat: 0.97 mg/dL (ref 0.60–1.35)
GFR, Est African American: 106 mL/min/{1.73_m2} (ref >=60)
GFR, Est Non African American: 91 mL/min/{1.73_m2} (ref >=60)
Globulin: 2.5 g/dL (calc) (ref 1.9–3.7)
Glucose, Bld: 136 mg/dL — ABNORMAL HIGH (ref 65–99)
Potassium: 4.1 mmol/L (ref 3.5–5.3)
Sodium: 140 mmol/L (ref 135–146)
Total Bilirubin: 0.5 mg/dL (ref 0.2–1.2)
Total Protein: 6.9 g/dL (ref 6.1–8.1)

## 2020-07-08 ENCOUNTER — Ambulatory Visit: Payer: BC Managed Care – PPO | Admitting: Family Medicine

## 2020-08-28 ENCOUNTER — Other Ambulatory Visit: Payer: Self-pay | Admitting: Family Medicine

## 2020-10-30 ENCOUNTER — Other Ambulatory Visit: Payer: Self-pay | Admitting: Family Medicine

## 2020-10-30 DIAGNOSIS — I10 Essential (primary) hypertension: Secondary | ICD-10-CM

## 2020-11-26 ENCOUNTER — Other Ambulatory Visit: Payer: Self-pay | Admitting: Family Medicine

## 2020-11-26 NOTE — Telephone Encounter (Signed)
Call pt for follow up on DM. 30 day supply sent

## 2020-11-27 NOTE — Telephone Encounter (Signed)
Patient has been scheduled for 12/13/2020. AM

## 2020-12-03 ENCOUNTER — Other Ambulatory Visit: Payer: Self-pay | Admitting: *Deleted

## 2020-12-03 MED ORDER — DAPAGLIFLOZIN PROPANEDIOL 5 MG PO TABS: 5.0000 mg | ORAL_TABLET | Freq: Every day | ORAL | 0 refills | Status: DC

## 2020-12-03 MED ORDER — ATORVASTATIN CALCIUM 20 MG PO TABS: 20.0000 mg | ORAL_TABLET | Freq: Every day | ORAL | 3 refills | Status: DC

## 2020-12-13 ENCOUNTER — Ambulatory Visit: Payer: BC Managed Care – PPO | Admitting: Family Medicine

## 2020-12-13 NOTE — Progress Notes (Deleted)
Established Patient Office Visit  Subjective:  Patient ID: David Cole, male    DOB: 25-Jan-1971  Age: 50 y.o. MRN: [id number]  CC: No chief complaint on file.   HPI David Cole presents for   Hypertension- Pt denies chest pain, SOB, dizziness, or heart palpitations.  Taking meds as directed w/o problems.  Denies medication side effects.    Diabetes - no hypoglycemic events. No wounds or sores that are not healing well. No increased thirst or urination. Checking glucose at home. Taking medications as prescribed without any side effects.   Past Medical History:  Diagnosis Date   Hypertension     No past surgical history on file.  Family History  Problem Relation Age of Onset   Alcohol abuse Unknown        GF    Social History   Socioeconomic History   Marital status: Married    Spouse name: [name]   Number of children: 1   Years of education: Not on file   Highest education level: Not on file  Occupational History   Occupation: Tax inspector: [employer]  Tobacco Use   Smoking status: Never   Smokeless tobacco: Never  Substance and Sexual Activity   Alcohol use: Yes    Alcohol/week: 1.0 - 3.0 standard drink    Types: 1 - 3 Standard drinks or equivalent per week    Comment: per week   Drug use: No   Sexual activity: Not on file  Other Topics Concern   Not on file  Social History Narrative   1 caffeine drink per day. Some college    Social Determinants of Health   Financial Resource Strain: Not on file  Food Insecurity: Not on file  Transportation Needs: Not on file  Physical Activity: Not on file  Stress: Not on file  Social Connections: Not on file  Intimate Partner Violence: Not on file    Outpatient Medications Prior to Visit  Medication Sig Dispense Refill   [place] Medication Name: Set CPAP to Autopap for 2 weeks and then fax download so we can see if need to adjust pressure.  I think he  uses Aeroflow. 1 vial 0   atorvastatin (LIPITOR) 20 MG tablet Take 1 tablet (20 mg total) by mouth at bedtime. 90 tablet 3   dapagliflozin propanediol (FARXIGA) 5 MG TABS tablet Take 1 tablet (5 mg total) by mouth daily before breakfast. 90 tablet 0   glucose blood (FREESTYLE TEST STRIPS) test strip USE TO CHECK FASTING BLOOD GLUCOSE DAILY 100 each 3   glucose monitoring kit (FREESTYLE) monitoring kit 1 each by Does not apply route as needed for other. Dx Diabetes type 2. 1 each PRN   JANUMET 50-1000 MG tablet TAKE 1 TABLET TWICE A DAY WITH MEALS 180 tablet 3   Lancets (FREESTYLE) lancets USE TO CHECK FASTING BLOOD GLUCOSE DAILY 100 each 3   meloxicam (MOBIC) 15 MG tablet Take 1 tablet (15 mg total) by mouth daily as needed for pain. For knee pain 30 tablet 0   metoprolol succinate (TOPROL-XL) 100 MG 24 hr tablet TAKE 1 TABLET DAILY WITH OR IMMEDIATELY FOLLOWING A MEAL 90 tablet 3   Olmesartan-amLODIPine-HCTZ 40-5-25 MG TABS TAKE 1 TABLET DAILY 90 tablet 3   sildenafil (VIAGRA) 100 MG tablet Take 0.5-1 tablets (50-100 mg total) by mouth daily as needed for erectile dysfunction. 10 tablet PRN   No facility-administered medications prior to visit.  No Known Allergies  ROS Review of Systems    Objective:    Physical Exam  There were no vitals taken for this visit. Wt Readings from Last 3 Encounters:  04/09/20 285 lb (129.3 kg)  02/12/20 277 lb (125.6 kg)  02/01/20 277 lb (125.6 kg)     Health Maintenance Due  Topic Date Due   Zoster Vaccines- Shingrix (1 of 2) Never done   COLONOSCOPY (Pts 45-3yr Insurance coverage will need to be confirmed)  Never done   COVID-19 Vaccine (3 - Pfizer risk series) 12/30/2019   INFLUENZA VACCINE  09/30/2020   HEMOGLOBIN A1C  10/07/2020    There are no preventive care reminders to display for this patient.  Lab Results  Component Value Date   TSH 1.643 09/15/2010   Lab Results  Component Value Date   WBC 8.6 05/19/2018   HGB 14.4  05/19/2018   HCT 42.7 05/19/2018   MCV 82.1 05/19/2018   PLT 219 05/19/2018   Lab Results  Component Value Date   NA 140 06/14/2020   K 4.1 06/14/2020   CO2 27 06/14/2020   GLUCOSE 136 (H) 06/14/2020   BUN 9 06/14/2020   CREATININE 0.97 06/14/2020   BILITOT 0.5 06/14/2020   ALKPHOS 95 03/17/2016   AST 34 06/14/2020   ALT 53 (H) 06/14/2020   PROT 6.9 06/14/2020   ALBUMIN 4.6 03/17/2016   CALCIUM 9.3 06/14/2020   Lab Results  Component Value Date   CHOL 136 06/14/2020   Lab Results  Component Value Date   HDL 34 (L) 06/14/2020   Lab Results  Component Value Date   LDLCALC 71 06/14/2020   Lab Results  Component Value Date   TRIG 217 (H) 06/14/2020   Lab Results  Component Value Date   CHOLHDL 4.0 06/14/2020   Lab Results  Component Value Date   HGBA1C 7.7 (A) 04/09/2020      Assessment & Plan:   Problem List Items Addressed This Visit       Cardiovascular and Mediastinum   Essential hypertension, benign     Endocrine   Type 2 diabetes mellitus without complication, without long-term current use of insulin ([place] Primary    No orders of the defined types were placed in this encounter.   Follow-up: No follow-ups on file.    CBeatrice Lecher MD

## 2021-01-09 ENCOUNTER — Encounter: Payer: Self-pay | Admitting: Family Medicine

## 2021-01-09 ENCOUNTER — Other Ambulatory Visit: Payer: Self-pay

## 2021-01-09 ENCOUNTER — Ambulatory Visit: Payer: BC Managed Care – PPO | Admitting: Family Medicine

## 2021-01-09 VITALS — BP 112/64 | HR 83 | Ht 69.0 in | Wt 273.0 lb

## 2021-01-09 DIAGNOSIS — I10 Essential (primary) hypertension: Secondary | ICD-10-CM | POA: Diagnosis not present

## 2021-01-09 DIAGNOSIS — Z125 Encounter for screening for malignant neoplasm of prostate: Secondary | ICD-10-CM | POA: Diagnosis not present

## 2021-01-09 DIAGNOSIS — L739 Follicular disorder, unspecified: Secondary | ICD-10-CM | POA: Diagnosis not present

## 2021-01-09 DIAGNOSIS — E119 Type 2 diabetes mellitus without complications: Secondary | ICD-10-CM | POA: Diagnosis not present

## 2021-01-09 DIAGNOSIS — R82998 Other abnormal findings in urine: Secondary | ICD-10-CM

## 2021-01-09 LAB — POCT UA - MICROALBUMIN
Albumin/Creatinine Ratio, Urine, POC: <30
Creatinine, POC: 200 mg/dL
Microalbumin Ur, POC: 30 mg/L

## 2021-01-09 LAB — POCT GLYCOSYLATED HEMOGLOBIN (HGB A1C): Hemoglobin A1C: 7 % — AB (ref 4.0–5.6)

## 2021-01-09 MED ORDER — CEPHALEXIN 500 MG PO CAPS: 500.0000 mg | ORAL_CAPSULE | Freq: Three times a day (TID) | ORAL | 0 refills | Status: DC

## 2021-01-09 NOTE — Assessment & Plan Note (Signed)
Well controlled. Continue current regimen. Follow up in  4 mo 

## 2021-01-09 NOTE — Assessment & Plan Note (Addendum)
A1c looks better today at 7.0 great work.  He is also lost about 12 pounds which is fantastic.  Just encouraged him to continue to work on healthy changes and keep up the exercise which has been doing as well.  Will be to hopefully discontinue the Januvia component of his medication regimen if we can get that A1c back down under 7.  We can switch him to a combination with [place] and metformin together.

## 2021-01-09 NOTE — Progress Notes (Signed)
Established Patient Office Visit  Subjective:  Patient ID: David Cole, male    DOB: 06-Feb-1971  Age: 50 y.o. MRN: [id number]  CC:  Chief Complaint  Patient presents with   Diabetes   Hypertension    HPI David Cole presents for   Diabetes - no hypoglycemic events. No wounds or sores that are not healing well. No increased thirst or urination. Checking glucose at home. Taking medications as prescribed without any side effects.  Hypertension- Pt denies chest pain, SOB, dizziness, or heart palpitations.  Taking meds as directed w/o problems.  Denies medication side effects.    He has lost 12 lbs since he was last here..  C/o fo a red bump on the left hip.  He noticed it a few days ago and says it was really quite tender he did try to squeeze it but nothing came out of it.  He says it is a little less tender today.   He also noticed that his urine was foamy recently but seems to have gotten better as well.  Past Medical History:  Diagnosis Date   Hypertension     No past surgical history on file.  Family History  Problem Relation Age of Onset   Alcohol abuse Unknown        GF    Social History   Socioeconomic History   Marital status: Married    Spouse name: [name]   Number of children: 1   Years of education: Not on file   Highest education level: Not on file  Occupational History   Occupation: Tax inspector: [employer]  Tobacco Use   Smoking status: Never   Smokeless tobacco: Never  Substance and Sexual Activity   Alcohol use: Yes    Alcohol/week: 1.0 - 3.0 standard drink    Types: 1 - 3 Standard drinks or equivalent per week    Comment: per week   Drug use: No   Sexual activity: Not on file  Other Topics Concern   Not on file  Social History Narrative   1 caffeine drink per day. Some college    Social Determinants of Health   Financial Resource Strain: Not on file  Food Insecurity: Not on file  Transportation Needs:  Not on file  Physical Activity: Not on file  Stress: Not on file  Social Connections: Not on file  Intimate Partner Violence: Not on file    Outpatient Medications Prior to Visit  Medication Sig Dispense Refill   [place] Medication Name: Set CPAP to Autopap for 2 weeks and then fax download so we can see if need to adjust pressure.  I think he uses Aeroflow. 1 vial 0   atorvastatin (LIPITOR) 20 MG tablet Take 1 tablet (20 mg total) by mouth at bedtime. 90 tablet 3   dapagliflozin propanediol (FARXIGA) 5 MG TABS tablet Take 1 tablet (5 mg total) by mouth daily before breakfast. 90 tablet 0   glucose blood (FREESTYLE TEST STRIPS) test strip USE TO CHECK FASTING BLOOD GLUCOSE DAILY 100 each 3   glucose monitoring kit (FREESTYLE) monitoring kit 1 each by Does not apply route as needed for other. Dx Diabetes type 2. 1 each PRN   JANUMET 50-1000 MG tablet TAKE 1 TABLET TWICE A DAY WITH MEALS 180 tablet 3   Lancets (FREESTYLE) lancets USE TO CHECK FASTING BLOOD GLUCOSE DAILY 100 each 3   meloxicam (MOBIC) 15 MG tablet Take 1 tablet (15  mg total) by mouth daily as needed for pain. For knee pain 30 tablet 0   metoprolol succinate (TOPROL-XL) 100 MG 24 hr tablet TAKE 1 TABLET DAILY WITH OR IMMEDIATELY FOLLOWING A MEAL 90 tablet 3   Olmesartan-amLODIPine-HCTZ 40-5-25 MG TABS TAKE 1 TABLET DAILY 90 tablet 3   sildenafil (VIAGRA) 100 MG tablet Take 0.5-1 tablets (50-100 mg total) by mouth daily as needed for erectile dysfunction. 10 tablet PRN   No facility-administered medications prior to visit.    No Known Allergies  ROS Review of Systems    Objective:    Physical Exam Constitutional:      Appearance: Normal appearance. He is well-developed.  HENT:     Head: Normocephalic and atraumatic.  Cardiovascular:     Rate and Rhythm: Normal rate and regular rhythm.     Heart sounds: Normal heart sounds.  Pulmonary:     Effort: Pulmonary effort is normal.     Breath  sounds: Normal breath sounds.  Skin:    General: Skin is warm and dry.  Neurological:     Mental Status: He is alert and oriented to person, place, and time. Mental status is at baseline.  Psychiatric:        Behavior: Behavior normal.    BP 112/64   Pulse 83   Ht _0  (1.753 m)   Wt 273 lb (123.8 kg)   SpO2 98%   BMI 40.32 kg/m  Wt Readings from Last 3 Encounters:  01/09/21 273 lb (123.8 kg)  04/09/20 285 lb (129.3 kg)  02/12/20 277 lb (125.6 kg)     Health Maintenance Due  Topic Date Due   Pneumococcal Vaccine 5-65 Years old (1 - PCV) Never done   Zoster Vaccines- Shingrix (1 of 2) Never done   COLONOSCOPY (Pts 45-50yr Insurance coverage will need to be confirmed)  Never done   COVID-19 Vaccine (3 - Pfizer risk series) 12/30/2019    There are no preventive care reminders to display for this patient.  Lab Results  Component Value Date   TSH 1.643 09/15/2010   Lab Results  Component Value Date   WBC 8.6 05/19/2018   HGB 14.4 05/19/2018   HCT 42.7 05/19/2018   MCV 82.1 05/19/2018   PLT 219 05/19/2018   Lab Results  Component Value Date   NA 140 06/14/2020   K 4.1 06/14/2020   CO2 27 06/14/2020   GLUCOSE 136 (H) 06/14/2020   BUN 9 06/14/2020   CREATININE 0.97 06/14/2020   BILITOT 0.5 06/14/2020   ALKPHOS 95 03/17/2016   AST 34 06/14/2020   ALT 53 (H) 06/14/2020   PROT 6.9 06/14/2020   ALBUMIN 4.6 03/17/2016   CALCIUM 9.3 06/14/2020   Lab Results  Component Value Date   CHOL 136 06/14/2020   Lab Results  Component Value Date   HDL 34 (L) 06/14/2020   Lab Results  Component Value Date   LDLCALC 71 06/14/2020   Lab Results  Component Value Date   TRIG 217 (H) 06/14/2020   Lab Results  Component Value Date   CHOLHDL 4.0 06/14/2020   Lab Results  Component Value Date   HGBA1C 7.0 (A) 01/09/2021      Assessment & Plan:   Problem List Items Addressed This Visit       Cardiovascular and Mediastinum   Essential hypertension, benign     Well controlled. Continue current regimen. Follow up in  4 mo       Relevant Orders  COMPLETE METABOLIC PANEL WITH GFR     Endocrine   Type 2 diabetes mellitus without complication, without long-term current use of insulin (HCC) - Primary    A1c looks better today at 7.0 great work.  He is also lost about 12 pounds which is fantastic.  Just encouraged him to continue to work on healthy changes and keep up the exercise which has been doing as well.  Will be to hopefully discontinue the Januvia component of his medication regimen if we can get that A1c back down under 7.  We can switch him to a combination with [place] and metformin together.      Relevant Orders   POCT glycosylated hemoglobin (Hb A1C) (Completed)   POCT UA - Microalbumin (Completed)   COMPLETE METABOLIC PANEL WITH GFR   Other Visit Diagnoses     Screening for prostate cancer       Relevant Orders   PSA   Folliculitis       Foamy urine           Folliculitis-we discussed that sometimes it will actually heal on its own and it does seem a little less tender today I do not feel anything that needs to be drained on exam.  But certainly if it gets worse okay to fill the prescription of the antibiotic and start taking that if it does get worse then please come in if not responding to the antibiotic in case it needs to be drained.  Foamy urine-urinalysis actually looks great today could also be secondary to his [place] especially if his blood sugars were elevated around that time.  Meds ordered this encounter  Medications   cephALEXin (KEFLEX) 500 MG capsule    Sig: Take 1 capsule (500 mg total) by mouth 3 (three) times daily.    Dispense:  21 capsule    Refill:  0   Discussed need for colon cancer screening and reviewed options with him encouraged him to consider it.  Follow-up: Return in about 3 months (around 04/11/2021) for Diabetes follow-up, Hypertension.    Beatrice Lecher, MD

## 2021-01-10 LAB — COMPLETE METABOLIC PANEL WITH GFR
AG Ratio: 1.8 (calc) (ref 1.0–2.5)
ALT: 69 U/L — ABNORMAL HIGH (ref 9–46)
AST: 40 U/L — ABNORMAL HIGH (ref 10–35)
Albumin: 4.7 g/dL (ref 3.6–5.1)
Alkaline phosphatase (APISO): 77 U/L (ref 35–144)
BUN: 17 mg/dL (ref 7–25)
CO2: 28 mmol/L (ref 20–32)
Calcium: 9.7 mg/dL (ref 8.6–10.3)
Chloride: 102 mmol/L (ref 98–110)
Creat: 1.2 mg/dL (ref 0.70–1.30)
Globulin: 2.6 g/dL (calc) (ref 1.9–3.7)
Glucose, Bld: 166 mg/dL — ABNORMAL HIGH (ref 65–99)
Potassium: 4.1 mmol/L (ref 3.5–5.3)
Sodium: 140 mmol/L (ref 135–146)
Total Bilirubin: 0.6 mg/dL (ref 0.2–1.2)
Total Protein: 7.3 g/dL (ref 6.1–8.1)
eGFR: 74 mL/min/{1.73_m2} (ref >=60)

## 2021-01-10 LAB — PSA: PSA: 0.66 ng/mL (ref <=4.00)

## 2021-01-10 NOTE — Progress Notes (Signed)
Hi David Cole, your prostate test is normal.  Liver enzymes are still elevated.  Lets plan to recheck that when I see you back in about 3 months.  Just continue to work on those healthy changes.

## 2021-01-15 ENCOUNTER — Other Ambulatory Visit: Payer: Self-pay | Admitting: *Deleted

## 2021-01-15 DIAGNOSIS — E119 Type 2 diabetes mellitus without complications: Secondary | ICD-10-CM

## 2021-01-15 MED ORDER — JANUMET 50-1000 MG PO TABS: 1.0000 | ORAL_TABLET | Freq: Two times a day (BID) | ORAL | 0 refills | Status: DC

## 2021-01-20 ENCOUNTER — Telehealth: Payer: Self-pay

## 2021-01-20 NOTE — Telephone Encounter (Signed)
Medication: sitaGLIPtin-metformin (JANUMET) 50-1000 MG tablet Prior authorization determination received Medication has been approved Approval dates: 12/21/2020-01/20/2022  Patient aware via: [place] aware: Yes Provider aware via this encounter

## 2021-01-20 NOTE — Telephone Encounter (Signed)
Medication: sitaGLIPtin-metformin (JANUMET) 50-1000 MG tablet Prior authorization submitted via CoverMyMeds on 01/20/2021 [place] submission pending

## 2021-02-13 ENCOUNTER — Other Ambulatory Visit: Payer: Self-pay | Admitting: Family Medicine

## 2021-03-06 ENCOUNTER — Encounter: Payer: Self-pay | Admitting: Family Medicine

## 2021-03-06 ENCOUNTER — Ambulatory Visit: Payer: BC Managed Care – PPO | Admitting: Family Medicine

## 2021-03-06 ENCOUNTER — Other Ambulatory Visit: Payer: Self-pay

## 2021-03-06 VITALS — BP 142/98 | HR 80 | Temp 98.6°F | Resp 18 | Ht 69.0 in | Wt 271.0 lb

## 2021-03-06 DIAGNOSIS — E119 Type 2 diabetes mellitus without complications: Secondary | ICD-10-CM

## 2021-03-06 DIAGNOSIS — H1033 Unspecified acute conjunctivitis, bilateral: Secondary | ICD-10-CM | POA: Diagnosis not present

## 2021-03-06 DIAGNOSIS — J069 Acute upper respiratory infection, unspecified: Secondary | ICD-10-CM | POA: Diagnosis not present

## 2021-03-06 MED ORDER — AZITHROMYCIN 1 % OP SOLN: OPHTHALMIC | 0 refills | Status: DC

## 2021-03-06 MED ORDER — JANUMET 50-1000 MG PO TABS: 1.0000 | ORAL_TABLET | Freq: Two times a day (BID) | ORAL | 0 refills | Status: DC

## 2021-03-06 NOTE — Progress Notes (Signed)
Acute Office Visit  Subjective:    Patient ID: David Cole, male    DOB: 1970/11/07, 51 y.o.   MRN: [id number]  Chief Complaint  Patient presents with   Conjunctivitis    Both eyes, itchy for 2 days. Covid test negative on 03/05/21.     HPI Patient is in today for bilateral irritated eyes that started about 2 days ago.  They have been crusting and draining.  He has had a little bit of haziness to his vision.  The just feels irritated.  For about a week he has had some slight chest congestion with a mild cough.  It started initially with a mild sore throat but that is resolved and has had a little pressure in his left ear.  It has made a little bit more difficult to wear his CPAP.  Says he noticed it after he went bowling.  Past Medical History:  Diagnosis Date   Hypertension     History reviewed. No pertinent surgical history.  Family History  Problem Relation Age of Onset   Alcohol abuse Unknown        GF    Social History   Socioeconomic History   Marital status: Married    Spouse name: [name]   Number of children: 1   Years of education: Not on file   Highest education level: Not on file  Occupational History   Occupation: Tax inspector: [employer]  Tobacco Use   Smoking status: Never   Smokeless tobacco: Never  Substance and Sexual Activity   Alcohol use: Yes    Alcohol/week: 1.0 - 3.0 standard drink    Types: 1 - 3 Standard drinks or equivalent per week    Comment: per week   Drug use: No   Sexual activity: Not on file  Other Topics Concern   Not on file  Social History Narrative   1 caffeine drink per day. Some college    Social Determinants of Health   Financial Resource Strain: Not on file  Food Insecurity: Not on file  Transportation Needs: Not on file  Physical Activity: Not on file  Stress: Not on file  Social Connections: Not on file  Intimate Partner Violence: Not on file    Outpatient Medications Prior to  Visit  Medication Sig Dispense Refill   [place] Medication Name: Set CPAP to Autopap for 2 weeks and then fax download so we can see if need to adjust pressure.  I think he uses Aeroflow. 1 vial 0   atorvastatin (LIPITOR) 20 MG tablet Take 1 tablet (20 mg total) by mouth at bedtime. 90 tablet 3   cephALEXin (KEFLEX) 500 MG capsule Take 1 capsule (500 mg total) by mouth 3 (three) times daily. 21 capsule 0   FARXIGA 5 MG TABS tablet TAKE 1 TABLET DAILY BEFORE BREAKFAST 90 tablet 3   glucose blood (FREESTYLE TEST STRIPS) test strip USE TO CHECK FASTING BLOOD GLUCOSE DAILY 100 each 3   glucose monitoring kit (FREESTYLE) monitoring kit 1 each by Does not apply route as needed for other. Dx Diabetes type 2. 1 each PRN   Lancets (FREESTYLE) lancets USE TO CHECK FASTING BLOOD GLUCOSE DAILY 100 each 3   meloxicam (MOBIC) 15 MG tablet Take 1 tablet (15 mg total) by mouth daily as needed for pain. For knee pain 30 tablet 0   metoprolol succinate (TOPROL-XL) 100 MG 24 hr tablet TAKE 1 TABLET DAILY WITH OR  IMMEDIATELY FOLLOWING A MEAL 90 tablet 3   Olmesartan-amLODIPine-HCTZ 40-5-25 MG TABS TAKE 1 TABLET DAILY 90 tablet 3   sildenafil (VIAGRA) 100 MG tablet Take 0.5-1 tablets (50-100 mg total) by mouth daily as needed for erectile dysfunction. 10 tablet PRN   sitaGLIPtin-metformin (JANUMET) 50-1000 MG tablet Take 1 tablet by mouth 2 (two) times daily with a meal. 60 tablet 0   No facility-administered medications prior to visit.    No Known Allergies  Review of Systems     Objective:    Physical Exam Constitutional:      Appearance: He is well-developed.  HENT:     Head: Normocephalic and atraumatic.     Right Ear: Tympanic membrane, ear canal and external ear normal.     Left Ear: Tympanic membrane, ear canal and external ear normal.     Nose: Nose normal.  Eyes:     Pupils: Pupils are equal, round, and reactive to light.     Comments: conjunctiva injected.  Lid  lashes with yellow discharge.  Lids are swollen.  Neck:     Thyroid: No thyromegaly.  Cardiovascular:     Rate and Rhythm: Normal rate.     Heart sounds: Normal heart sounds.  Pulmonary:     Effort: Pulmonary effort is normal.     Breath sounds: Normal breath sounds.  Musculoskeletal:     Cervical back: Neck supple.  Lymphadenopathy:     Cervical: No cervical adenopathy.  Skin:    General: Skin is warm and dry.  Neurological:     Mental Status: He is alert and oriented to person, place, and time.    BP (!) 142/98    Pulse 80    Temp 98.6 F (37 C)    Resp 18    Ht '5\' 9"'  (1.753 m)    Wt 271 lb (122.9 kg)    SpO2 95%    BMI 40.02 kg/m  Wt Readings from Last 3 Encounters:  03/06/21 271 lb (122.9 kg)  01/09/21 273 lb (123.8 kg)  04/09/20 285 lb (129.3 kg)    There are no preventive care reminders to display for this patient.  There are no preventive care reminders to display for this patient.   Lab Results  Component Value Date   TSH 1.643 09/15/2010   Lab Results  Component Value Date   WBC 8.6 05/19/2018   HGB 14.4 05/19/2018   HCT 42.7 05/19/2018   MCV 82.1 05/19/2018   PLT 219 05/19/2018   Lab Results  Component Value Date   NA 140 01/09/2021   K 4.1 01/09/2021   CO2 28 01/09/2021   GLUCOSE 166 (H) 01/09/2021   BUN 17 01/09/2021   CREATININE 1.20 01/09/2021   BILITOT 0.6 01/09/2021   ALKPHOS 95 03/17/2016   AST 40 (H) 01/09/2021   ALT 69 (H) 01/09/2021   PROT 7.3 01/09/2021   ALBUMIN 4.6 03/17/2016   CALCIUM 9.7 01/09/2021   EGFR 74 01/09/2021   Lab Results  Component Value Date   CHOL 136 06/14/2020   Lab Results  Component Value Date   HDL 34 (L) 06/14/2020   Lab Results  Component Value Date   LDLCALC 71 06/14/2020   Lab Results  Component Value Date   TRIG 217 (H) 06/14/2020   Lab Results  Component Value Date   CHOLHDL 4.0 06/14/2020   Lab Results  Component Value Date   HGBA1C 7.0 (A) 01/09/2021       Assessment & Plan:  Problem List Items Addressed This Visit       Endocrine   Type 2 diabetes mellitus without complication, without long-term current use of insulin (HCC)   Relevant Medications   sitaGLIPtin-metformin (JANUMET) 50-1000 MG tablet   Other Visit Diagnoses     Acute conjunctivitis of both eyes, unspecified acute conjunctivitis type    -  Primary   Relevant Medications   azithromycin (AZASITE) 1 % ophthalmic solution   Viral upper respiratory tract infection          Acute conjunctivitis of both eyes-we will treat with azithromycin ophthalmic solution unclear if it could be bacterial.  He certainly could have touched something while bowling and then touched his eye.  But he is also had URI symptoms for about a week and so this could also be viral.  It is actually pretty pretty impressive on exam today with a lot of drainage and gout.  Discussed how to do some good eye hygiene to keep the areas clean and.  Call if not better in 1 week.  URI-continue with symptomatic care.  If not better in 1 week please give [place] call back.  Meds ordered this encounter  Medications   sitaGLIPtin-metformin (JANUMET) 50-1000 MG tablet    Sig: Take 1 tablet by mouth 2 (two) times daily with a meal.    Dispense:  180 tablet    Refill:  0   azithromycin (AZASITE) 1 % ophthalmic solution    Sig: 1 drop in each eye twice a day for 2 days, then daily x5 days    Dispense:  2.5 mL    Refill:  0     Beatrice Lecher, MD

## 2021-04-11 ENCOUNTER — Other Ambulatory Visit: Payer: Self-pay

## 2021-04-11 ENCOUNTER — Ambulatory Visit: Payer: BC Managed Care – PPO | Admitting: Family Medicine

## 2021-04-11 ENCOUNTER — Encounter: Payer: Self-pay | Admitting: Family Medicine

## 2021-04-11 VITALS — BP 146/81 | HR 73 | Resp 18 | Ht 69.0 in | Wt 272.0 lb

## 2021-04-11 DIAGNOSIS — L659 Nonscarring hair loss, unspecified: Secondary | ICD-10-CM

## 2021-04-11 DIAGNOSIS — E119 Type 2 diabetes mellitus without complications: Secondary | ICD-10-CM | POA: Diagnosis not present

## 2021-04-11 DIAGNOSIS — I10 Essential (primary) hypertension: Secondary | ICD-10-CM

## 2021-04-11 DIAGNOSIS — R051 Acute cough: Secondary | ICD-10-CM | POA: Diagnosis not present

## 2021-04-11 LAB — POCT GLYCOSYLATED HEMOGLOBIN (HGB A1C): Hemoglobin A1C: 7.5 % — AB (ref 4.0–5.6)

## 2021-04-11 MED ORDER — DAPAGLIFLOZIN PROPANEDIOL 10 MG PO TABS: 10.0000 mg | ORAL_TABLET | Freq: Every day | ORAL | 0 refills | Status: DC

## 2021-04-11 NOTE — Patient Instructions (Signed)
We will bump up your Farxiga to 10 mg. Continue to work on portion control and cutting back on carbs and sweets. Recommend a trial of Protonix or Prilosec or Prevacid for 2 weeks taken at bedtime to see if it improves your cough.  If not then I recommend a trial of an over-the-counter antihistamine such as Claritin, Allegra or Zyrtec for 2 weeks.  If that still not helpful then please let me know and we will refer you to ENT for further work-up.

## 2021-04-11 NOTE — Progress Notes (Signed)
Patient declined to schedule Colonoscopy at this time. Patient also declined Cologuard order.

## 2021-04-11 NOTE — Assessment & Plan Note (Signed)
A1c elevated at 7.5 which is up from previous.  Discussed Trelegy is to get back on track.  Now that he is working from home he is just much more sedentary so we discussed some strategies to really increase activity level.  He is not using a pillbox so feels like that is been helping him be more consistent with his medication.  We will increase Farxiga to 10 mg.  And have him follow-up in 3 months.

## 2021-04-11 NOTE — Progress Notes (Signed)
Established Patient Office Visit  Subjective:  Patient ID: David Cole, male    DOB: 05-11-1970  Age: 51 y.o. MRN: [id number]  CC:  Chief Complaint  Patient presents with   Diabetes    Follow up    Hypertension    Follow up    Cough    1 month, productive cough     HPI David Cole presents for   Hypertension- Pt denies chest pain, SOB, dizziness, or heart palpitations.  Taking meds as directed w/o problems.  Denies medication side effects.    Diabetes - no hypoglycemic events. No wounds or sores that are not healing well. No increased thirst or urination. Checking glucose at home. Taking medications as prescribed without any side effects.   Also c/o peristant cough for several weeks.    Past Medical History:  Diagnosis Date   Hypertension     History reviewed. No pertinent surgical history.  Family History  Problem Relation Age of Onset   Alcohol abuse Unknown        GF    Social History   Socioeconomic History   Marital status: Married    Spouse name: [name]   Number of children: 1   Years of education: Not on file   Highest education level: Not on file  Occupational History   Occupation: Engineer, mining: [employer]  Tobacco Use   Smoking status: Never   Smokeless tobacco: Never  Substance and Sexual Activity   Alcohol use: Yes    Alcohol/week: 1.0 - 3.0 standard drink    Types: 1 - 3 Standard drinks or equivalent per week    Comment: per week   Drug use: No   Sexual activity: Not on file  Other Topics Concern   Not on file  Social History Narrative   1 caffeine drink per day. Some college    Social Determinants of Health   Financial Resource Strain: Not on file  Food Insecurity: Not on file  Transportation Needs: Not on file  Physical Activity: Not on file  Stress: Not on file  Social Connections: Not on file  Intimate Partner Violence: Not on file    Outpatient Medications Prior to Visit  Medication Sig  Dispense Refill   AMBULATORY NON FORMULARY MEDICATION Medication Name: Set CPAP to Autopap for 2 weeks and then fax download so we can see if need to adjust pressure.  I think he uses Aeroflow. 1 vial 0   atorvastatin (LIPITOR) 20 MG tablet Take 1 tablet (20 mg total) by mouth at bedtime. 90 tablet 3   glucose blood (FREESTYLE TEST STRIPS) test strip USE TO CHECK FASTING BLOOD GLUCOSE DAILY 100 each 3   glucose monitoring kit (FREESTYLE) monitoring kit 1 each by Does not apply route as needed for other. Dx Diabetes type 2. 1 each PRN   Lancets (FREESTYLE) lancets USE TO CHECK FASTING BLOOD GLUCOSE DAILY 100 each 3   metoprolol succinate (TOPROL-XL) 100 MG 24 hr tablet TAKE 1 TABLET DAILY WITH OR IMMEDIATELY FOLLOWING A MEAL 90 tablet 3   Olmesartan-amLODIPine-HCTZ 40-5-25 MG TABS TAKE 1 TABLET DAILY 90 tablet 3   sildenafil (VIAGRA) 100 MG tablet Take 0.5-1 tablets (50-100 mg total) by mouth daily as needed for erectile dysfunction. 10 tablet PRN   sitaGLIPtin-metformin (JANUMET) 50-1000 MG tablet Take 1 tablet by mouth 2 (two) times daily with a meal. 180 tablet 0   FARXIGA 5 MG TABS tablet TAKE 1 TABLET DAILY BEFORE  BREAKFAST 90 tablet 3   azithromycin (AZASITE) 1 % ophthalmic solution 1 drop in each eye twice a day for 2 days, then daily x5 days 2.5 mL 0   No facility-administered medications prior to visit.    No Known Allergies  ROS Review of Systems    Objective:    Physical Exam Constitutional:      Appearance: Normal appearance. He is well-developed.  HENT:     Head: Normocephalic and atraumatic.  Cardiovascular:     Rate and Rhythm: Normal rate and regular rhythm.     Heart sounds: Normal heart sounds.  Pulmonary:     Effort: Pulmonary effort is normal.     Breath sounds: Normal breath sounds.  Skin:    General: Skin is warm and dry.  Neurological:     Mental Status: He is alert and oriented to person, place, and time. Mental status is at baseline.  Psychiatric:         Behavior: Behavior normal.    BP (!) 146/81 (BP Location: Left Arm)    Pulse 73    Resp 18    Ht $R'5\' 9"'iB$  (1.753 m)    Wt 272 lb (123.4 kg)    SpO2 97%    BMI 40.17 kg/m  Wt Readings from Last 3 Encounters:  04/11/21 272 lb (123.4 kg)  03/06/21 271 lb (122.9 kg)  01/09/21 273 lb (123.8 kg)     There are no preventive care reminders to display for this patient.   There are no preventive care reminders to display for this patient.  Lab Results  Component Value Date   TSH 1.643 09/15/2010   Lab Results  Component Value Date   WBC 8.6 05/19/2018   HGB 14.4 05/19/2018   HCT 42.7 05/19/2018   MCV 82.1 05/19/2018   PLT 219 05/19/2018   Lab Results  Component Value Date   NA 140 01/09/2021   K 4.1 01/09/2021   CO2 28 01/09/2021   GLUCOSE 166 (H) 01/09/2021   BUN 17 01/09/2021   CREATININE 1.20 01/09/2021   BILITOT 0.6 01/09/2021   ALKPHOS 95 03/17/2016   AST 40 (H) 01/09/2021   ALT 69 (H) 01/09/2021   PROT 7.3 01/09/2021   ALBUMIN 4.6 03/17/2016   CALCIUM 9.7 01/09/2021   EGFR 74 01/09/2021   Lab Results  Component Value Date   CHOL 136 06/14/2020   Lab Results  Component Value Date   HDL 34 (L) 06/14/2020   Lab Results  Component Value Date   LDLCALC 71 06/14/2020   Lab Results  Component Value Date   TRIG 217 (H) 06/14/2020   Lab Results  Component Value Date   CHOLHDL 4.0 06/14/2020   Lab Results  Component Value Date   HGBA1C 7.5 (A) 04/11/2021      Assessment & Plan:   Problem List Items Addressed This Visit       Cardiovascular and Mediastinum   Essential hypertension, benign    BP up today. Will folllow at nextt OV. Looked great in November.   BP Readings from Last 3 Encounters:  04/11/21 (!) 146/81  03/06/21 (!) 142/98  01/09/21 112/64          Endocrine   Type 2 diabetes mellitus without complication, without long-term current use of insulin (HCC) - Primary    A1c elevated at 7.5 which is up from previous.  Discussed Trelegy is  to get back on track.  Now that he is working from home he is  just much more sedentary so we discussed some strategies to really increase activity level.  He is not using a pillbox so feels like that is been helping him be more consistent with his medication.  We will increase Farxiga to 10 mg.  And have him follow-up in 3 months.      Relevant Medications   dapagliflozin propanediol (FARXIGA) 10 MG TABS tablet   Other Relevant Orders   POCT HgB A1C (Completed)   Other Visit Diagnoses     Acute cough       Hair loss       Relevant Orders   Ambulatory referral to Dermatology       Hair loss-he has symmetric hair loss on both upper thighs he says he does not put anything in his pockets on purpose and does not feel like he has a lot of irritation or friction to the area.  There is no erythema rash or scaling on exam.  He has not have any other areas affected in fact he has quite thick hair on his lower legs.  Discussed that this is not typical for diabetic hair loss so at this point I will refer him to dermatology.  Cough-he has had a cough for well over a month since he last had a upper respiratory infection he says is just lingering he feels like there is a buildup of something and almost like a fullness like there is a tonsillar stone in the back of his throat.  He says he is tried to push it and massaged it and just sometimes does not get a lot of relief.  He says the mucus that he coughs is mostly white.  He does not feel like it is deep in his chest.  No shortness of breath or fever.  Sometimes has to prop himself up at night when he sleeps he is he does wear CPAP.  Meds ordered this encounter  Medications   dapagliflozin propanediol (FARXIGA) 10 MG TABS tablet    Sig: Take 1 tablet (10 mg total) by mouth daily before breakfast.    Dispense:  90 tablet    Refill:  0    Follow-up: Return in about 3 months (around 07/09/2021) for Diabetes, Hypertension follow up .    Beatrice Lecher, MD

## 2021-04-13 NOTE — Assessment & Plan Note (Signed)
BP up today. Will folllow at nextt OV. Looked great in November.   BP Readings from Last 3 Encounters:  04/11/21 (!) 146/81  03/06/21 (!) 142/98  01/09/21 112/64

## 2021-05-02 ENCOUNTER — Ambulatory Visit: Payer: BC Managed Care – PPO

## 2021-05-16 DIAGNOSIS — L659 Nonscarring hair loss, unspecified: Secondary | ICD-10-CM | POA: Diagnosis not present

## 2021-06-11 ENCOUNTER — Other Ambulatory Visit: Payer: Self-pay | Admitting: Family Medicine

## 2021-06-17 ENCOUNTER — Other Ambulatory Visit: Payer: Self-pay

## 2021-06-17 DIAGNOSIS — M25562 Pain in left knee: Secondary | ICD-10-CM

## 2021-06-17 MED ORDER — MELOXICAM 15 MG PO TABS: 15.0000 mg | ORAL_TABLET | Freq: Every day | ORAL | 0 refills | Status: DC | PRN

## 2021-06-19 ENCOUNTER — Encounter: Payer: Self-pay | Admitting: Family Medicine

## 2021-06-19 ENCOUNTER — Ambulatory Visit: Payer: BC Managed Care – PPO | Admitting: Family Medicine

## 2021-06-19 VITALS — BP 135/70 | HR 76 | Resp 18 | Ht 69.0 in | Wt 272.0 lb

## 2021-06-19 DIAGNOSIS — J069 Acute upper respiratory infection, unspecified: Secondary | ICD-10-CM | POA: Diagnosis not present

## 2021-06-19 DIAGNOSIS — S91339A Puncture wound without foreign body, unspecified foot, initial encounter: Secondary | ICD-10-CM

## 2021-06-19 DIAGNOSIS — R051 Acute cough: Secondary | ICD-10-CM

## 2021-06-19 NOTE — Progress Notes (Signed)
? ?Acute Office Visit ? ?Subjective:  ? ?  ?Patient ID: David Cole, male    DOB: 16-Nov-1970, 51 y.o.   MRN: [id number] ? ?Chief Complaint  ?Patient presents with  ? Foot Injury  ?  Patient states he stepped on a nail 1 day ago. Right foot, painful. Patient would like to know if he needs another Tetanus vaccine.   ? Cough  ?  Productive cough, nasal congestion for 3 days.   ? Diabetes  ?  Done at [facility] in [place].   ? ? ?HPI ?Patient is in today for Patient states he stepped on a nail 1 day ago. Right foot, painful. Patient would like to know if he needs another Tetanus vaccine. Last in 2016.  He has had some soreness and pain.  He has been able to walk on the foot. ? ?Cough with nasal congestion for 3 days.  No fever or chills.  He did have a low-grade temp the first night around 99 and some sore throat.   Feels like it could be from his CPAP.  He recently bought a new sanitizer that uses UV.  So he is starting to use that.  He has had a family member in the house who has been sick but slightly different symptoms ? ?ROS ? ? ?   ?Objective:  ?  ?BP 135/70   Pulse 76   Resp 18   Ht '5\' 9"'$  (1.753 m)   Wt 272 lb (123.4 kg)   SpO2 96%   BMI 40.17 kg/m?  ? ? ?Physical Exam ?Constitutional:   ?   Appearance: He is well-developed.  ?HENT:  ?   Head: Normocephalic and atraumatic.  ?   Right Ear: External ear normal.  ?   Left Ear: External ear normal.  ?   Nose: Nose normal.  ?Eyes:  ?   Conjunctiva/sclera: Conjunctivae normal.  ?   Pupils: Pupils are equal, round, and reactive to light.  ?Neck:  ?   Thyroid: No thyromegaly.  ?Cardiovascular:  ?   Rate and Rhythm: Normal rate and regular rhythm.  ?   Heart sounds: Normal heart sounds.  ?Pulmonary:  ?   Effort: Pulmonary effort is normal.  ?   Breath sounds: Normal breath sounds.  ?Musculoskeletal:  ?   Cervical back: Neck supple.  ?Lymphadenopathy:  ?   Cervical: No cervical adenopathy.  ?Skin: ?   General: Skin is warm and dry.  ?   Comments: Entry wound present  at the ball of the foot.  No surrounding erythema or streaking no active drainage.  No palpable fluctuation or mass underneath the skin.  Able to move foot and toes normally.  ?Neurological:  ?   Mental Status: He is alert and oriented to person, place, and time.  ?Psychiatric:     ?   Behavior: Behavior normal.  ? ? ?No results found for any visits on 06/19/21. ? ? ?   ?Assessment & Plan:  ? ?Problem List Items Addressed This Visit   ?None ?Visit Diagnoses   ? ? Nail wound of foot, unspecified laterality, initial encounter    -  Primary  ? Acute cough      ? Viral upper respiratory tract infection      ? ?  ? ? ?Upper respiratory tract infection-consider viral versus possibly could be coming from his CPAP.  We discussed continuing to watch it over the weekend and if at any point he feels like he is not improving  over the next week or if he feels like he suddenly getting worse to please let us know and we will call in an antibiotic at that time. ? ?Nail wound to foot-exam looks very reassuring I do not palpate any abscess no drainage.  He is able to move his toes well without significant difficulty pain and discomfort should improve over the next week and if for any reason its not please let us know and we can always get an x-ray just to make sure there is no sign of residual foreign body etc. Tdap is up-to-date. ? ?No orders of the defined types were placed in this encounter. ? ? ?No follow-ups on file. ? ?Beatrice Lecher, MD ? ? ?

## 2021-07-02 ENCOUNTER — Other Ambulatory Visit: Payer: Self-pay | Admitting: Family Medicine

## 2021-07-02 DIAGNOSIS — E119 Type 2 diabetes mellitus without complications: Secondary | ICD-10-CM

## 2021-07-10 ENCOUNTER — Ambulatory Visit: Payer: BC Managed Care – PPO | Admitting: Family Medicine

## 2021-07-10 ENCOUNTER — Encounter: Payer: Self-pay | Admitting: Family Medicine

## 2021-07-10 VITALS — BP 133/66 | HR 72 | Ht 69.0 in | Wt 271.0 lb

## 2021-07-10 DIAGNOSIS — E119 Type 2 diabetes mellitus without complications: Secondary | ICD-10-CM | POA: Diagnosis not present

## 2021-07-10 DIAGNOSIS — I10 Essential (primary) hypertension: Secondary | ICD-10-CM | POA: Diagnosis not present

## 2021-07-10 DIAGNOSIS — Z125 Encounter for screening for malignant neoplasm of prostate: Secondary | ICD-10-CM

## 2021-07-10 DIAGNOSIS — E785 Hyperlipidemia, unspecified: Secondary | ICD-10-CM

## 2021-07-10 LAB — POCT GLYCOSYLATED HEMOGLOBIN (HGB A1C): Hemoglobin A1C: 6.7 % — AB (ref 4.0–5.6)

## 2021-07-10 MED ORDER — DAPAGLIFLOZIN PROPANEDIOL 10 MG PO TABS: 10.0000 mg | ORAL_TABLET | Freq: Every day | ORAL | 1 refills | Status: DC

## 2021-07-10 NOTE — Progress Notes (Signed)
Eye exam done at [facility] will fax for records. ? ?Discussed Colon cancer screening w/pt he hasn't made up his mind about which he would like to do.  ?

## 2021-07-10 NOTE — Assessment & Plan Note (Signed)
Well controlled. Continue current regimen. Follow up in  3-4 months.  

## 2021-07-10 NOTE — Assessment & Plan Note (Signed)
A1c honestly looks great today it is down to 6.7, from 7.5 which is fantastic.  So I think incorporating the regular exercises been really helpful.  Encouraged him to continue to work on portion control.  1 option would be to consider switching him to [facility] when I see him back if he still really struggling with being able to lose weight which I know is one of his goals.  It might be more helpful than the Janumet. ?

## 2021-07-10 NOTE — Progress Notes (Signed)
? ?Established Patient Office Visit ? ?Subjective   ?Patient ID: David Cole, male    DOB: 07-21-1970  Age: 51 y.o. MRN: [id number] ? ?Chief Complaint  ?Patient presents with  ? Diabetes  ? Hypertension  ? ? ?HPI ? ?Hypertension- Pt denies chest pain, SOB, dizziness, or heart palpitations.  Taking meds as directed w/o problems.  Denies medication side effects.   ? ?Diabetes - no hypoglycemic events. No wounds or sores that are not healing well. No increased thirst or urination. Checking glucose at home. Taking medications as prescribed without any side effects. ? ?He has been walking at the gym on the indoor track 2 to 3 days/week for an hour.  [facility]  He says he is just a little frustrated that he has not really seen the scale move.  Though he is down a pound since I last saw him.  Says he typically will burn about 400 cal on the treadmill.  He has been tolerating the medication well without any side effects or problems. ? ? ? ?ROS ? ?  ?Objective:  ?  ? ?BP 133/66   Pulse 72   Ht '5\' 9"'$  (1.753 m)   Wt 271 lb (122.9 kg)   SpO2 97%   BMI 40.02 kg/m?  ? ? ?Physical Exam ?Constitutional:   ?   Appearance: He is well-developed.  ?HENT:  ?   Head: Normocephalic and atraumatic.  ?Cardiovascular:  ?   Rate and Rhythm: Normal rate and regular rhythm.  ?   Heart sounds: Normal heart sounds.  ?Pulmonary:  ?   Effort: Pulmonary effort is normal.  ?   Breath sounds: Normal breath sounds.  ?Skin: ?   General: Skin is warm and dry.  ?Neurological:  ?   Mental Status: He is alert and oriented to person, place, and time.  ?Psychiatric:     ?   Behavior: Behavior normal.  ? ? ?Results for orders placed or performed in visit on 07/10/21  ?POCT glycosylated hemoglobin (Hb A1C)  ?Result Value Ref Range  ? Hemoglobin A1C 6.7 (A) 4.0 - 5.6 %  ? HbA1c POC (<> result, manual entry)    ? HbA1c, POC (prediabetic range)    ? HbA1c, POC (controlled diabetic range)    ? ? ? ? ?The 10-year ASCVD  risk score (Arnett DK, et al., 2019) is: 7.2% ? ?  ?Assessment & Plan:  ? ?Problem List Items Addressed This Visit   ? ?  ? Cardiovascular and Mediastinum  ? Essential hypertension, benign - Primary  ?  Well controlled. Continue current regimen. Follow up in  3-4 months ? ?  ?  ? Relevant Orders  ? Lipid panel  ? COMPLETE METABOLIC PANEL WITH GFR  ?  ? Endocrine  ? Type 2 diabetes mellitus without complication, without long-term current use of insulin ([facility])  ?  A1c honestly looks great today it is down to 6.7, from 7.5 which is fantastic.  So I think incorporating the regular exercises been really helpful.  Encouraged him to continue to work on portion control.  1 option would be to consider switching him to [facility] when I see him back if he still really struggling with being able to lose weight which I know is one of his goals.  It might be more helpful than the Janumet. ? ?  ?  ? Relevant Medications  ? dapagliflozin propanediol (FARXIGA) 10 MG TABS tablet  ?  Other Relevant Orders  ? POCT glycosylated hemoglobin (Hb A1C) (Completed)  ? Lipid panel  ? COMPLETE METABOLIC PANEL WITH GFR  ?  ? Other  ? Hyperlipidemia  ?  Due to recheck lipids. ? ?  ?  ? Relevant Orders  ? Lipid panel  ? COMPLETE METABOLIC PANEL WITH GFR  ? ?Other Visit Diagnoses   ? ? Screening for prostate cancer      ? Relevant Orders  ? PSA  ? ?  ? ? ?Know he is a little frustrated with not having really lost a significant amount of weight but I do think his changes have been incredibly powerful to lower his A1c as well as his blood pressure which is fantastic.  We will continue to monitor for now. ? ?Return in about 3 months (around 10/13/2021) for Diabetes follow-up.  ? ? ?Beatrice Lecher, MD ? ?

## 2021-07-10 NOTE — Assessment & Plan Note (Signed)
Due to recheck lipids. 

## 2021-07-17 ENCOUNTER — Other Ambulatory Visit: Payer: Self-pay | Admitting: Family Medicine

## 2021-07-17 DIAGNOSIS — M25562 Pain in left knee: Secondary | ICD-10-CM

## 2021-09-10 ENCOUNTER — Other Ambulatory Visit: Payer: Self-pay | Admitting: Family Medicine

## 2021-10-13 ENCOUNTER — Ambulatory Visit: Payer: BC Managed Care – PPO | Admitting: Family Medicine

## 2021-10-13 ENCOUNTER — Encounter: Payer: Self-pay | Admitting: Family Medicine

## 2021-10-13 VITALS — BP 154/78 | HR 82 | Ht 69.0 in | Wt 271.0 lb

## 2021-10-13 DIAGNOSIS — E119 Type 2 diabetes mellitus without complications: Secondary | ICD-10-CM | POA: Diagnosis not present

## 2021-10-13 DIAGNOSIS — H60542 Acute eczematoid otitis externa, left ear: Secondary | ICD-10-CM

## 2021-10-13 DIAGNOSIS — K760 Fatty (change of) liver, not elsewhere classified: Secondary | ICD-10-CM

## 2021-10-13 DIAGNOSIS — I10 Essential (primary) hypertension: Secondary | ICD-10-CM

## 2021-10-13 DIAGNOSIS — Z125 Encounter for screening for malignant neoplasm of prostate: Secondary | ICD-10-CM | POA: Diagnosis not present

## 2021-10-13 DIAGNOSIS — E785 Hyperlipidemia, unspecified: Secondary | ICD-10-CM | POA: Diagnosis not present

## 2021-10-13 LAB — POCT GLYCOSYLATED HEMOGLOBIN (HGB A1C): HbA1c POC (<> result, manual entry): 6.8 % (ref 4.0–5.6)

## 2021-10-13 LAB — POCT UA - MICROALBUMIN
Albumin/Creatinine Ratio, Urine, POC: <30
Creatinine, POC: 200 mg/dL
Microalbumin Ur, POC: 10 mg/L

## 2021-10-13 NOTE — Progress Notes (Signed)
Established Patient Office Visit  Subjective   Patient ID: David Cole, male    DOB: 1970/07/07  Age: 51 y.o. MRN: [id number]  Chief Complaint  Patient presents with   Follow-up    Dm follow up last a1c 6.9    HPI Hypertension- Pt denies chest pain, SOB, dizziness, or heart palpitations.  Taking meds as directed w/o problems.  Denies medication side effects.    Diabetes - no hypoglycemic events. No wounds or sores that are not healing well. No increased thirst or urination. Checking glucose at home. Taking medications as prescribed without any side effects.  He did have 1 day where he felt a little lightheaded.  Weight has been stable.  Would like to get labs done.   He would also like me to check his left ear he has been getting a buildup of either scale or scabbing in that ear does not necessarily feel irritated or itchy.  But it keeps coming back.    ROS    Objective:     BP (!) 154/78   Pulse 82   Ht '5\' 9"'$  (1.753 m)   Wt 271 lb (122.9 kg)   SpO2 99%   BMI 40.02 kg/m    Physical Exam Constitutional:      Appearance: He is well-developed.  HENT:     Head: Normocephalic and atraumatic.     Right Ear: Tympanic membrane, ear canal and external ear normal.     Left Ear: Tympanic membrane and external ear normal.     Ears:     Comments: Just a little bit of flaky dead skin in the ear canal. Cardiovascular:     Rate and Rhythm: Normal rate and regular rhythm.     Heart sounds: Normal heart sounds.  Pulmonary:     Effort: Pulmonary effort is normal.     Breath sounds: Normal breath sounds.  Skin:    General: Skin is warm and dry.  Neurological:     Mental Status: He is alert and oriented to person, place, and time.  Psychiatric:        Behavior: Behavior normal.      Results for orders placed or performed in visit on 10/13/21  POCT UA - Microalbumin  Result Value Ref Range   Microalbumin Ur, POC 10 mg/L   Creatinine, POC 200 mg/dL   Albumin/Creatinine  Ratio, Urine, POC <30   POCT HgB A1C  Result Value Ref Range   Hemoglobin A1C     HbA1c POC (<> result, manual entry) 6.8 4.0 - 5.6 %   HbA1c, POC (prediabetic range)     HbA1c, POC (controlled diabetic range)        The 10-year ASCVD risk score (Arnett DK, et al., 2019) is: 10.2%    Assessment & Plan:   Problem List Items Addressed This Visit       Cardiovascular and Mediastinum   Essential hypertension, benign - Primary    Blood pressure was elevated today written recommend return in 2 weeks for nurse visit for repeat blood pressure check.        Digestive   Fatty liver    Continue to work on healthy food choices, portion control, regular exercise and weight loss.        Endocrine   Type 2 diabetes mellitus without complication, without long-term current use of insulin (HCC)    A1c is stable.  Continue to work on Administrator, sports.  We also discussed the possibility of a GLP-1.  I think it could help with his A1c as well as weight loss and reducing fat content of the liver.  Follow-up in 3 to 4 months.      Relevant Orders   POCT UA - Microalbumin (Completed)   POCT HgB A1C (Completed)   Other Visit Diagnoses     Dermatitis of ear canal, left           Dermatitis of your canal-recommend application of over-the-counter hydrocortisone once or twice a day for 10 days to see if it improves.  Return in about 14 weeks (around 01/19/2022) for Diabetes follow-up.    Beatrice Lecher, MD

## 2021-10-13 NOTE — Assessment & Plan Note (Addendum)
A1c is stable.  Continue to work on Administrator, sports.  We also discussed the possibility of a GLP-1.  I think it could help with his A1c as well as weight loss and reducing fat content of the liver.  Follow-up in 3 to 4 months.

## 2021-10-13 NOTE — Assessment & Plan Note (Signed)
Continue to work on [employer], portion control, regular exercise and weight loss.

## 2021-10-13 NOTE — Assessment & Plan Note (Signed)
Blood pressure was elevated today written recommend return in 2 weeks for nurse visit for repeat blood pressure check.

## 2021-10-14 ENCOUNTER — Other Ambulatory Visit: Payer: Self-pay | Admitting: Family Medicine

## 2021-10-14 DIAGNOSIS — K760 Fatty (change of) liver, not elsewhere classified: Secondary | ICD-10-CM

## 2021-10-14 DIAGNOSIS — R748 Abnormal levels of other serum enzymes: Secondary | ICD-10-CM

## 2021-10-14 LAB — COMPLETE METABOLIC PANEL WITH GFR
AG Ratio: 1.5 (calc) (ref 1.0–2.5)
ALT: 67 U/L — ABNORMAL HIGH (ref 9–46)
AST: 38 U/L — ABNORMAL HIGH (ref 10–35)
Albumin: 4.7 g/dL (ref 3.6–5.1)
Alkaline phosphatase (APISO): 86 U/L (ref 35–144)
BUN/Creatinine Ratio: 16 (calc) (ref 6–22)
BUN: 21 mg/dL (ref 7–25)
CO2: 27 mmol/L (ref 20–32)
Calcium: 9.6 mg/dL (ref 8.6–10.3)
Chloride: 99 mmol/L (ref 98–110)
Creat: 1.32 mg/dL — ABNORMAL HIGH (ref 0.70–1.30)
Globulin: 3.1 g/dL (calc) (ref 1.9–3.7)
Glucose, Bld: 151 mg/dL — ABNORMAL HIGH (ref 65–139)
Potassium: 4.2 mmol/L (ref 3.5–5.3)
Sodium: 139 mmol/L (ref 135–146)
Total Bilirubin: 0.6 mg/dL (ref 0.2–1.2)
Total Protein: 7.8 g/dL (ref 6.1–8.1)
eGFR: 65 mL/min/{1.73_m2} (ref >=60)

## 2021-10-14 LAB — LIPID PANEL
Cholesterol: 207 mg/dL — ABNORMAL HIGH (ref <200)
HDL: 32 mg/dL — ABNORMAL LOW (ref >=40)
Non-HDL Cholesterol (Calc): 175 mg/dL — ABNORMAL HIGH (ref <130)
Total CHOL/HDL Ratio: 6.5 (calc) — ABNORMAL HIGH (ref <5.0)
Triglycerides: 539 mg/dL — ABNORMAL HIGH (ref <150)

## 2021-10-14 LAB — PSA: PSA: 0.67 ng/mL (ref <=4.00)

## 2021-10-14 NOTE — Progress Notes (Signed)
Hi David Cole, triglycerides jumped up significantly last year they were 217 this year they were 539.  We were unable to calculate your LDL.  Lease make sure that you are taking your Lipitor regularly.  If you have been taking it regularly, then please let me know as we do need to make an adjustment to your medication regimen.  Your liver function is still elevated and similar to last time.  I would really like to consider putting you on a GLP-1, weekly injection to help control your blood sugars and help reduce weight.  I think it could make a big impact on your liver function.  Prostate test is normal.  I also like to get an ultrasound of your liver and abdomen scheduled since its been 5 years since your last one.

## 2021-10-16 ENCOUNTER — Ambulatory Visit (INDEPENDENT_AMBULATORY_CARE_PROVIDER_SITE_OTHER): Payer: BC Managed Care – PPO

## 2021-10-16 DIAGNOSIS — K760 Fatty (change of) liver, not elsewhere classified: Secondary | ICD-10-CM

## 2021-10-16 DIAGNOSIS — R945 Abnormal results of liver function studies: Secondary | ICD-10-CM | POA: Diagnosis not present

## 2021-10-16 DIAGNOSIS — R748 Abnormal levels of other serum enzymes: Secondary | ICD-10-CM

## 2021-10-17 NOTE — Progress Notes (Signed)
HI Richardson Landry,  Your US shows fatty liver.  This can definitely cause the elevation in your enzymes.  Higher fat in the liver can eventually cause scarring in the liver and cause cirrhosis.  So the most important treatment is really lifestyle change  Changing your diet to eat more veggies and less carbs and sweets and cutting out all sugary drinks. Also working on weight loss and exercise will reduce the fatty  tissue in the liver.  We can usually follow the liver enzymes to see if the liver if working better Plan to recheck your liver enzymes in 3 months. The spleen is mildly enlarged.  This doesn't usually cause any symptoms.  You do ot have any findings that are concerning in this regard.

## 2021-10-27 ENCOUNTER — Other Ambulatory Visit: Payer: Self-pay | Admitting: Family Medicine

## 2021-10-27 DIAGNOSIS — I10 Essential (primary) hypertension: Secondary | ICD-10-CM

## 2021-11-10 ENCOUNTER — Other Ambulatory Visit: Payer: Self-pay | Admitting: Family Medicine

## 2022-01-12 ENCOUNTER — Other Ambulatory Visit: Payer: Self-pay | Admitting: Family Medicine

## 2022-01-12 DIAGNOSIS — E119 Type 2 diabetes mellitus without complications: Secondary | ICD-10-CM

## 2022-01-15 ENCOUNTER — Ambulatory Visit: Payer: BC Managed Care – PPO | Admitting: Family Medicine

## 2022-02-13 ENCOUNTER — Encounter: Payer: Self-pay | Admitting: Family Medicine

## 2022-02-13 ENCOUNTER — Ambulatory Visit: Payer: BC Managed Care – PPO | Admitting: Family Medicine

## 2022-02-13 VITALS — BP 141/74 | HR 74 | Ht 69.0 in | Wt 273.1 lb

## 2022-02-13 DIAGNOSIS — G4733 Obstructive sleep apnea (adult) (pediatric): Secondary | ICD-10-CM

## 2022-02-13 DIAGNOSIS — D1722 Benign lipomatous neoplasm of skin and subcutaneous tissue of left arm: Secondary | ICD-10-CM

## 2022-02-13 DIAGNOSIS — K760 Fatty (change of) liver, not elsewhere classified: Secondary | ICD-10-CM

## 2022-02-13 DIAGNOSIS — E119 Type 2 diabetes mellitus without complications: Secondary | ICD-10-CM | POA: Diagnosis not present

## 2022-02-13 DIAGNOSIS — I10 Essential (primary) hypertension: Secondary | ICD-10-CM | POA: Diagnosis not present

## 2022-02-13 LAB — POCT GLYCOSYLATED HEMOGLOBIN (HGB A1C): Hemoglobin A1C: 9 % — AB (ref 4.0–5.6)

## 2022-02-13 NOTE — Assessment & Plan Note (Signed)
Uncontrolled. A1C went up to 9.0.  work on diet and exercise.  Consider GLP- he will check with insurance.    Feel free to check with your insurance to see if they would cover Ozempic, Mounjaro, Rybelsus, Trulicity, or Victoza.  Send me your blood sugar numbers 3 fasting and 3 postmeal sugars each week for the next 2 weeks.

## 2022-02-13 NOTE — Progress Notes (Signed)
Established Patient Office Visit  Subjective   Patient ID: David Cole, male    DOB: 1970-09-08  Age: 51 y.o. MRN: [id number]  Chief Complaint  Patient presents with   Diabetes    HPI  Hypertension- Pt denies chest pain, SOB, dizziness, or heart palpitations.  Taking meds as directed w/o problems.  Denies medication side effects.    Diabetes - no hypoglycemic events. No wounds or sores that are not healing well. No increased thirst or urination. Not checking glucose at home. Taking medications as prescribed without any side effects.Repprts has been more consistent with meds.  Denies any major changes io diet or exercise.   Has a lump near the left elbow wants examined today.    Needs new CPAP machine ordered.    ROS    Objective:     BP (!) 141/74 (BP Location: Left Arm, Patient Position: Sitting, Cuff Size: Large)   Pulse 74   Ht '5\' 9"'$  (1.753 m)   Wt 273 lb 1.3 oz (123.9 kg)   SpO2 95%   BMI 40.33 kg/m    Physical Exam Constitutional:      Appearance: He is well-developed.  HENT:     Head: Normocephalic and atraumatic.  Cardiovascular:     Rate and Rhythm: Normal rate and regular rhythm.     Heart sounds: Normal heart sounds.  Pulmonary:     Effort: Pulmonary effort is normal.     Breath sounds: Normal breath sounds.  Skin:    General: Skin is warm and dry.  Neurological:     Mental Status: He is alert and oriented to person, place, and time.  Psychiatric:        Behavior: Behavior normal.     Results for orders placed or performed in visit on 02/13/22  POCT glycosylated hemoglobin (Hb A1C)  Result Value Ref Range   Hemoglobin A1C 9.0 (A) 4.0 - 5.6 %   HbA1c POC (<> result, manual entry)     HbA1c, POC (prediabetic range)     HbA1c, POC (controlled diabetic range)        The 10-year ASCVD risk score (Arnett DK, et al., 2019) is: 16.1%    Assessment & Plan:   Problem List Items Addressed This Visit       Cardiovascular and Mediastinum    Essential hypertension, benign - Primary    Repeat BP not at goal. Will continue to monitor.         Respiratory   OSA on CPAP    Will need updated CPAP machine.        Relevant Medications   AMBULATORY NON FORMULARY MEDICATION     Digestive   Fatty liver    Important to reduce the A1C        Endocrine   Type 2 diabetes mellitus without complication, without long-term current use of insulin (HCC)    Uncontrolled. A1C went up to 9.0.  work on diet and exercise.  Consider GLP- he will check with insurance.    Feel free to check with your insurance to see if they would cover Ozempic, Mounjaro, Rybelsus, Trulicity, or Victoza.  Send me your blood sugar numbers 3 fasting and 3 postmeal sugars each week for the next 2 weeks.      Relevant Orders   POCT glycosylated hemoglobin (Hb A1C) (Completed)   Other Visit Diagnoses     Lipoma of left upper extremity           Gave reassurance -  lump near left elbow most consistnant with Lipoma.   Return in about 3 months (around 05/15/2022) for Diabetes follow-up.   I spent 25 minutes on the day of the encounter to include pre-visit record review, face-to-face time with the patient and post visit ordering of test.   Beatrice Lecher, MD

## 2022-02-13 NOTE — Assessment & Plan Note (Signed)
Important to reduce the A1C

## 2022-02-13 NOTE — Patient Instructions (Addendum)
Feel free to check with your insurance to see if they would cover Ozempic, Mounjaro, Rybelsus, Trulicity, or Victoza.  Send me your blood sugar numbers 3 fasting and 3 postmeal sugars each week for the next 2 weeks.

## 2022-02-16 MED ORDER — AMBULATORY NON FORMULARY MEDICATION: 0 refills | Status: DC

## 2022-02-16 NOTE — Addendum Note (Signed)
Addended by: Beatrice Lecher D on: 02/16/2022 12:17 PM   Modules accepted: Orders

## 2022-02-16 NOTE — Assessment & Plan Note (Signed)
Will need updated CPAP machine.

## 2022-02-16 NOTE — Assessment & Plan Note (Signed)
Repeat BP not at goal. Will continue to monitor.

## 2022-03-19 ENCOUNTER — Encounter: Payer: Self-pay | Admitting: Family Medicine

## 2022-04-07 ENCOUNTER — Encounter: Payer: Self-pay | Admitting: Family Medicine

## 2022-04-15 ENCOUNTER — Other Ambulatory Visit: Payer: Self-pay | Admitting: *Deleted

## 2022-04-15 DIAGNOSIS — G4733 Obstructive sleep apnea (adult) (pediatric): Secondary | ICD-10-CM

## 2022-04-15 MED ORDER — AMBULATORY NON FORMULARY MEDICATION: 0 refills | Status: AC

## 2022-04-17 DIAGNOSIS — G4733 Obstructive sleep apnea (adult) (pediatric): Secondary | ICD-10-CM | POA: Diagnosis not present

## 2022-04-21 ENCOUNTER — Encounter: Payer: Self-pay | Admitting: Family Medicine

## 2022-04-21 ENCOUNTER — Ambulatory Visit: Payer: BC Managed Care – PPO | Admitting: Family Medicine

## 2022-04-21 VITALS — BP 132/73 | HR 72 | Ht 69.0 in | Wt 276.0 lb

## 2022-04-21 DIAGNOSIS — I10 Essential (primary) hypertension: Secondary | ICD-10-CM

## 2022-04-21 DIAGNOSIS — E119 Type 2 diabetes mellitus without complications: Secondary | ICD-10-CM

## 2022-04-21 DIAGNOSIS — G4733 Obstructive sleep apnea (adult) (pediatric): Secondary | ICD-10-CM

## 2022-04-21 NOTE — Progress Notes (Signed)
   Established Patient Office Visit  Subjective   Patient ID: David Cole, male    DOB: [birth year]  Age: 52 y.o. MRN: [id number]  Chief Complaint  Patient presents with   Sleep Apnea    HPI  F/U CPAP - still awaiting a new CPAP machine.  Still using the old one.  He still has supplies.  See note above in regard to conversation with DME supplier this afternoon.    F/U DM - fasting glucose good in AM after he bowls the night before but running up to 200 on other nights.  He is taking the [place] and the [place]. Has enough for about 2  more months. Hasn't had a chance to check on the GLP 1 .       ROS    Objective:     BP 132/73   Pulse 72   Ht 5' 9"$  (1.753 m)   Wt 276 lb (125.2 kg)   SpO2 96%   BMI 40.76 kg/m    Physical Exam Vitals reviewed.  Constitutional:      Appearance: He is well-developed.  HENT:     Head: Normocephalic and atraumatic.  Eyes:     Conjunctiva/sclera: Conjunctivae normal.  Cardiovascular:     Rate and Rhythm: Normal rate.  Pulmonary:     Effort: Pulmonary effort is normal.  Skin:    General: Skin is dry.     Coloration: Skin is not pale.  Neurological:     Mental Status: He is alert and oriented to person, place, and time.  Psychiatric:        Behavior: Behavior normal.      No results found for any visits on 04/21/22.    The 10-year ASCVD risk score (Arnett DK, et al., 2019) is: 14.4%    Assessment & Plan:   Problem List Items Addressed This Visit       Cardiovascular and Mediastinum   Essential hypertension, benign    BP much better today.  Will follow.         Respiratory   OSA on CPAP - Primary    Awaiting new machine. Pls call us back if not delivered by end of this week.         Endocrine   Type 2 diabetes mellitus without complication, without long-term current use of insulin (HCC)    Last A1C not at goal. Plan to check what insurance will cover and start a GLP-1. If covered will d/c the Januvia.  Continue  to work on portion, healthy diet and exercise.    List of medications for diabetes: Victoza which is a daily injectable, Mounjaro which is a weekly injectable, Ozempic which is a weekly injectable but hard to get right now because of backorder issues.  Rybelsus is a daily pill.  Please see if the insurance would be willing to cover any of these prescriptions.       Discussed need for screening for colon ca. He will check with his insurance.   No follow-ups on file.    Beatrice Lecher, MD

## 2022-04-21 NOTE — Assessment & Plan Note (Signed)
Awaiting new machine. Pls call us back if not delivered by end of this week.

## 2022-04-21 NOTE — Patient Instructions (Signed)
Please check with your insurance to see how well they will cover a screening colonoscopy or screening Cologuard for colon cancer screening.  Either 1 is a fine choice.  The Cologuard is good for 3 years if it is negative and the colonoscopy is good for 10 years if it is negative.  List of medications for diabetes: Victoza which is a daily injectable, Mounjaro which is a weekly injectable, Ozempic which is a weekly injectable but hard to get right now because of backorder issues.  Rybelsus is a daily pill.  Please see if the insurance would be willing to cover any of these prescriptions.

## 2022-04-21 NOTE — Progress Notes (Signed)
Pt was advised that I spoke with Chloe a representative at [employer] and asked her about whether or not they received his CPAP order. She informed me that they did get this and that it was delivered to the patient on 2/16 and that the message on the machine would not affect the machines performance. He can keep this machine as a back up it is good for 5 yrs. She stated that one of their clinicians would be out to see him to show him how the machine operates. Pt told me that he has a video call on 2/23 for thisand was told to take a selfie and find a mask that suits the contours of his face.

## 2022-04-21 NOTE — Assessment & Plan Note (Signed)
Last A1C not at goal. Plan to check what insurance will cover and start a GLP-1. If covered will d/c the Januvia.  Continue to work on portion, healthy diet and exercise.    List of medications for diabetes: Victoza which is a daily injectable, Mounjaro which is a weekly injectable, Ozempic which is a weekly injectable but hard to get right now because of backorder issues.  Rybelsus is a daily pill.  Please see if the insurance would be willing to cover any of these prescriptions.

## 2022-04-21 NOTE — Assessment & Plan Note (Signed)
BP much better today.  Will follow.

## 2022-04-24 ENCOUNTER — Telehealth: Payer: Self-pay

## 2022-04-24 DIAGNOSIS — E119 Type 2 diabetes mellitus without complications: Secondary | ICD-10-CM

## 2022-04-24 NOTE — Telephone Encounter (Signed)
Pt called and LVM stating that he had contacted his insurance company about the diabetic medications that Dr. Madilyn Fireman had advised him about . He said that Victoza is NOT covered but they will cover Wegovy, Ozempic and Reybelsus. Pt stated he would rather do a weekly injectable.   He has 3.5 bottles left of the Janumet and stated that this medication is really costly.   Will fwd to pcp for recommendations for medication. [facility] would like 90 day supply

## 2022-04-27 MED ORDER — OZEMPIC (0.25 OR 0.5 MG/DOSE) 2 MG/3ML ~~LOC~~ SOPN: 0.2500 mg | PEN_INJECTOR | SUBCUTANEOUS | 0 refills | Status: DC

## 2022-04-27 MED ORDER — METFORMIN HCL ER 500 MG PO TB24: 500.0000 mg | ORAL_TABLET | Freq: Every day | ORAL | 1 refills | Status: DC

## 2022-04-27 MED ORDER — OZEMPIC (0.25 OR 0.5 MG/DOSE) 2 MG/3ML ~~LOC~~ SOPN: 0.5000 mg | PEN_INJECTOR | SUBCUTANEOUS | 0 refills | Status: DC

## 2022-04-27 NOTE — Telephone Encounter (Signed)
Prescription sent to start [place].  Is not a 90-day supply because we have to taper his dose upward.  Will start with 0.25 mg weekly for 1 month and then go to 0.5 mg weekly for 1 month and then we can go to the 1 mg if he is tolerating it well.  I did discontinue the Janumet on his med list and will send in metformin which is generic in its place with the idea that we can get the Ozempic time to work.

## 2022-04-30 NOTE — Telephone Encounter (Signed)
David Cole wanted to know if he could stay on the [place] until he finishes the 3 bottles, 90 day supply and not start the Ozempic and metformin.

## 2022-05-01 NOTE — Telephone Encounter (Signed)
Patient advised.

## 2022-05-01 NOTE — Telephone Encounter (Signed)
Yes, that is fine. 

## 2022-05-15 ENCOUNTER — Ambulatory Visit: Payer: BC Managed Care – PPO | Admitting: Family Medicine

## 2022-05-15 ENCOUNTER — Telehealth: Payer: Self-pay | Admitting: Family Medicine

## 2022-05-15 ENCOUNTER — Encounter: Payer: Self-pay | Admitting: Family Medicine

## 2022-05-15 VITALS — BP 133/62 | HR 68 | Ht 69.0 in | Wt 271.0 lb

## 2022-05-15 DIAGNOSIS — E119 Type 2 diabetes mellitus without complications: Secondary | ICD-10-CM | POA: Diagnosis not present

## 2022-05-15 DIAGNOSIS — I10 Essential (primary) hypertension: Secondary | ICD-10-CM

## 2022-05-15 LAB — POCT GLYCOSYLATED HEMOGLOBIN (HGB A1C): Hemoglobin A1C: 7.5 % — AB (ref 4.0–5.6)

## 2022-05-15 NOTE — Progress Notes (Signed)
   Established Patient Office Visit  Subjective   Patient ID: Dezmond Klocke, male    DOB: 1970-06-02  Age: 52 y.o. MRN: [id number]  Chief Complaint  Patient presents with   Diabetes    Diabetes - no hypoglycemic events. No wounds or sores that are not healing well. No increased thirst or urination. Checking glucose at home. Taking medications as prescribed without any side effects.he has started the ozempic yet. Hasn't gotten the shipment in .  Still taking janumet bc he still has some but did get the bottle of metformin.   Hypertension- Pt denies chest pain, SOB, dizziness, or heart palpitations.  Taking meds as directed w/o problems.  Denies medication side effects.     HPI    ROS    Objective:     BP 133/62   Pulse 68   Ht 5\' 9"  (1.753 m)   Wt 271 lb (122.9 kg)   SpO2 98%   BMI 40.02 kg/m    Physical Exam Constitutional:      Appearance: He is well-developed.  HENT:     Head: Normocephalic and atraumatic.  Cardiovascular:     Rate and Rhythm: Normal rate and regular rhythm.     Heart sounds: Normal heart sounds.  Pulmonary:     Effort: Pulmonary effort is normal.     Breath sounds: Normal breath sounds.  Skin:    General: Skin is warm and dry.  Neurological:     Mental Status: He is alert and oriented to person, place, and time.  Psychiatric:        Behavior: Behavior normal.      Results for orders placed or performed in visit on 05/15/22  POCT glycosylated hemoglobin (Hb A1C)  Result Value Ref Range   Hemoglobin A1C 7.5 (A) 4.0 - 5.6 %   HbA1c POC (<> result, manual entry)     HbA1c, POC (prediabetic range)     HbA1c, POC (controlled diabetic range)        The 10-year ASCVD risk score (Arnett DK, et al., 2019) is: 14.6%    Assessment & Plan:   Problem List Items Addressed This Visit       Cardiovascular and Mediastinum   Essential hypertension, benign    Pressure just a little borderline today.  He has lost some weight which is fantastic.   I do and get him started on the semaglutide      Relevant Orders   BASIC METABOLIC PANEL WITH GFR     Endocrine   Type 2 diabetes mellitus without complication, without long-term current use of insulin ([place]) - Primary    I would like to get him started on the semaglutide to promote reduction in his A1c as well as weight loss and reduction in blood pressure.  I am not sure why they have not shipped out his medications will have to call the mail order pharmacy and find out.  He can certainly continue the Janumet until he runs out and then switch to metformin.  Okay to continue with [place] as well.  Follow-up in 3 months.      Relevant Orders   POCT glycosylated hemoglobin (Hb A1C) (Completed)   BASIC METABOLIC PANEL WITH GFR    Return in about 3 months (around 08/24/2022) for Diabetes follow-up.    Beatrice Lecher, MD

## 2022-05-15 NOTE — Assessment & Plan Note (Signed)
Pressure just a little borderline today.  He has lost some weight which is fantastic.  I do and get him started on the semaglutide

## 2022-05-15 NOTE — Assessment & Plan Note (Signed)
I would like to get him started on the semaglutide to promote reduction in his A1c as well as weight loss and reduction in blood pressure.  I am not sure why they have not shipped out his medications will have to call the mail order pharmacy and find out.  He can certainly continue the Janumet until he runs out and then switch to metformin.  Okay to continue with [place] as well.  Follow-up in 3 months.

## 2022-05-15 NOTE — Telephone Encounter (Signed)
Pls call mailorder to see why haven't sent the ozempicl.

## 2022-05-16 DIAGNOSIS — G4733 Obstructive sleep apnea (adult) (pediatric): Secondary | ICD-10-CM | POA: Diagnosis not present

## 2022-05-16 LAB — BASIC METABOLIC PANEL WITH GFR
BUN: 16 mg/dL (ref 7–25)
CO2: 23 mmol/L (ref 20–32)
Calcium: 9.2 mg/dL (ref 8.6–10.3)
Chloride: 105 mmol/L (ref 98–110)
Creat: 0.88 mg/dL (ref 0.70–1.30)
Glucose, Bld: 175 mg/dL — ABNORMAL HIGH (ref 65–99)
Potassium: 3.9 mmol/L (ref 3.5–5.3)
Sodium: 141 mmol/L (ref 135–146)
eGFR: 104 mL/min/{1.73_m2} (ref >=60)

## 2022-05-18 NOTE — Progress Notes (Signed)
Your lab work is within acceptable range and there are no concerning findings.   ?

## 2022-06-12 ENCOUNTER — Encounter: Payer: Self-pay | Admitting: Family Medicine

## 2022-06-12 ENCOUNTER — Telehealth (INDEPENDENT_AMBULATORY_CARE_PROVIDER_SITE_OTHER): Payer: BC Managed Care – PPO | Admitting: Family Medicine

## 2022-06-12 DIAGNOSIS — E119 Type 2 diabetes mellitus without complications: Secondary | ICD-10-CM | POA: Diagnosis not present

## 2022-06-12 DIAGNOSIS — G4733 Obstructive sleep apnea (adult) (pediatric): Secondary | ICD-10-CM | POA: Diagnosis not present

## 2022-06-12 NOTE — Assessment & Plan Note (Signed)
So far tolerating Ozempic well.  Plan will be to go up to 0.5 mg after 4 injections.  Keep follow-up for diabetes in June.

## 2022-06-12 NOTE — Assessment & Plan Note (Signed)
Compliance report looks fantastic today.  Continue current regimen he is getting symptom relief and feels better rested during the day and it relieves his snoring.  Fax over note today to the DME supplier.

## 2022-06-12 NOTE — Progress Notes (Signed)
Called [facility] [phone] to get pt's most recent download for cpap  Sppke w/pt he stated that he needed to have this appt for insurance purposes   Pt has started the Ozempic he has had 2 doses so far. He hasn't started the Metformin as of yet and is still taking the Janumet.

## 2022-06-12 NOTE — Progress Notes (Signed)
    Virtual Visit via Video Note  I connected with Amarii Nini on 06/12/22 at  1:00 PM EDT by a video enabled telemedicine application and verified that I am speaking with the correct person using two identifiers.   I discussed the limitations of evaluation and management by telemedicine and the availability of in person appointments. The patient expressed understanding and agreed to proceed.  Patient location: at home Provider location: in office  Subjective:    CC:  No chief complaint on file.   HPI: Go over CPAP download.  He received his new machine in February and has been using it every night since he received it.  He says it is "super effective and can't live without it". He feels it really relieves his snoring.   I did get a copy of his compliance report from [employer].  He has been using it for greater than 4 hours 100% of the time.  On average using it for 7 hours and 7 minutes.  Current pressure is set to 11 cm of water pressure.  AHI of 1.1 which is absolutely fantastic and AI of 0.8.  See download.  He did let me know he did receive the Ozempic from his mail order and started it a week ago he will be doing his second injection tomorrow so far he has tolerated it well maybe had a little bit of stomach cramping but nothing unusual maybe a little bit of loose stool.  But so far so good.  Past medical history, Surgical history, Family history not pertinant except as noted below, Social history, Allergies, and medications have been entered into the medical record, reviewed, and corrections made.    Objective:    General: Speaking clearly in complete sentences without any shortness of breath.  Alert and oriented x3.  Normal judgment. No apparent acute distress.    Impression and Recommendations:    Problem List Items Addressed This Visit       Respiratory   OSA on CPAP - Primary    Compliance report looks fantastic today.  Continue current regimen he is getting symptom  relief and feels better rested during the day and it relieves his snoring.  Fax over note today to the DME supplier.        Endocrine   Type 2 diabetes mellitus without complication, without long-term current use of insulin    So far tolerating Ozempic well.  Plan will be to go up to 0.5 mg after 4 injections.  Keep follow-up for diabetes in June.       No orders of the defined types were placed in this encounter.   No orders of the defined types were placed in this encounter.    I discussed the assessment and treatment plan with the patient. The patient was provided an opportunity to ask questions and all were answered. The patient agreed with the plan and demonstrated an understanding of the instructions.   The patient was advised to call back or seek an in-person evaluation if the symptoms worsen or if the condition fails to improve as anticipated.   Nani Gasser, MD

## 2022-06-16 DIAGNOSIS — G4733 Obstructive sleep apnea (adult) (pediatric): Secondary | ICD-10-CM | POA: Diagnosis not present

## 2022-06-17 DIAGNOSIS — G4733 Obstructive sleep apnea (adult) (pediatric): Secondary | ICD-10-CM | POA: Diagnosis not present

## 2022-06-30 ENCOUNTER — Telehealth: Payer: Self-pay | Admitting: Family Medicine

## 2022-06-30 DIAGNOSIS — E119 Type 2 diabetes mellitus without complications: Secondary | ICD-10-CM

## 2022-06-30 MED ORDER — SEMAGLUTIDE (1 MG/DOSE) 4 MG/3ML ~~LOC~~ SOPN: 1.0000 mg | PEN_INJECTOR | SUBCUTANEOUS | 1 refills | Status: DC

## 2022-06-30 NOTE — Telephone Encounter (Signed)
error 

## 2022-06-30 NOTE — Telephone Encounter (Signed)
Pt is wondering when he will get his next  Ozempic refill and only has one more left before he runs out on Saturday?   Pt also wants Dr.Metheney to know that he doesn't feel like it is working because his blood sugars are still running high 135--165.  Please call pt and advise

## 2022-06-30 NOTE — Telephone Encounter (Signed)
Lets try going up to at least the 1 mg and trying that for a month before we change medications.  This type of drug does take a while to reach steady state and the 1 mg dose may work better.  Continue to work on [employer], portion control and increasing exercise levels and drinking plenty of water all of this should help with blood sugar levels as well.  Meds ordered this encounter  Medications   Semaglutide, 1 MG/DOSE, 4 MG/3ML SOPN    Sig: Inject 1 mg as directed once a week.    Dispense:  3 mL    Refill:  1

## 2022-07-01 NOTE — Telephone Encounter (Signed)
I called David Cole and he states he has been on the 0.25 mg dose not the 0.5 mg dose. He has already picked up the 1 mg dose of the Ozempic. We may have samples for the 0.25 mg/ 0.5 mg.

## 2022-07-01 NOTE — Telephone Encounter (Signed)
Patient advised. He will come by and pick up the sample.

## 2022-07-01 NOTE — Telephone Encounter (Signed)
Ok to provide sample. No sure why he never picked up the 0.5mg   I removed it from med list when I ordered the 1mg 

## 2022-07-03 ENCOUNTER — Telehealth: Payer: Self-pay | Admitting: Family Medicine

## 2022-07-03 NOTE — Telephone Encounter (Signed)
Spoke with patient. He has not had eye exam and will call to schedule this.

## 2022-07-03 NOTE — Telephone Encounter (Signed)
Call pt and see if had eye exam this year. We need to get rport or if he needs to schedule pls remind hm

## 2022-07-16 ENCOUNTER — Telehealth: Payer: Self-pay | Admitting: Family Medicine

## 2022-07-16 DIAGNOSIS — E119 Type 2 diabetes mellitus without complications: Secondary | ICD-10-CM

## 2022-07-16 MED ORDER — DAPAGLIFLOZIN PROPANEDIOL 10 MG PO TABS: ORAL_TABLET | ORAL | 1 refills | Status: DC

## 2022-07-16 NOTE — Telephone Encounter (Signed)
Prescription sent.  Please let pt know

## 2022-07-16 NOTE — Telephone Encounter (Signed)
Pt called. He is following up on request for refill. [facility]([place]) sent initial request for refill on Faxiga May 2 and another today.

## 2022-07-20 ENCOUNTER — Other Ambulatory Visit: Payer: Self-pay | Admitting: Family Medicine

## 2022-07-20 DIAGNOSIS — E119 Type 2 diabetes mellitus without complications: Secondary | ICD-10-CM

## 2022-07-20 MED ORDER — DAPAGLIFLOZIN PROPANEDIOL 5 MG PO TABS: 5.0000 mg | ORAL_TABLET | Freq: Every day | ORAL | 0 refills | Status: DC

## 2022-07-20 NOTE — Progress Notes (Signed)
Received notification from [facility] that Farxiga 10 mg is not covered and they recommended one of the preferred alternatives which includes Fl[facility] and [employer].  Will try resending Marcelline Deist as a 5 mg dose to see if covered if not we will change to Jardiance.

## 2022-07-24 ENCOUNTER — Other Ambulatory Visit: Payer: Self-pay | Admitting: Family Medicine

## 2022-07-24 DIAGNOSIS — E119 Type 2 diabetes mellitus without complications: Secondary | ICD-10-CM

## 2022-08-12 ENCOUNTER — Telehealth: Payer: Self-pay | Admitting: Family Medicine

## 2022-08-12 DIAGNOSIS — E119 Type 2 diabetes mellitus without complications: Secondary | ICD-10-CM

## 2022-08-12 MED ORDER — OZEMPIC (2 MG/DOSE) 8 MG/3ML ~~LOC~~ SOPN: 2.0000 mg | PEN_INJECTOR | SUBCUTANEOUS | 11 refills | Status: DC

## 2022-08-12 NOTE — Telephone Encounter (Signed)
Patient called requesting a refill of Ozempic 0.5   Patient stated the medication was never called in.  Pharmacy -   [facility] [phone] - [place], [place] - [address]

## 2022-08-12 NOTE — Telephone Encounter (Signed)
Excellent question, his A1c was not ideal and he could definitely lose some more weight, I will go ahead and send in a higher dose.

## 2022-08-12 NOTE — Telephone Encounter (Signed)
Should I send in the 1mg  or refill the 0.5mg 

## 2022-08-13 NOTE — Telephone Encounter (Signed)
Patient notified

## 2022-09-04 ENCOUNTER — Ambulatory Visit: Payer: BC Managed Care – PPO | Admitting: Family Medicine

## 2022-09-07 ENCOUNTER — Encounter: Payer: Self-pay | Admitting: Family Medicine

## 2022-09-07 ENCOUNTER — Other Ambulatory Visit: Payer: Self-pay | Admitting: Family Medicine

## 2022-09-07 ENCOUNTER — Ambulatory Visit: Payer: BC Managed Care – PPO | Admitting: Family Medicine

## 2022-09-07 VITALS — BP 128/70 | HR 78 | Ht 69.0 in | Wt 260.0 lb

## 2022-09-07 DIAGNOSIS — E119 Type 2 diabetes mellitus without complications: Secondary | ICD-10-CM | POA: Diagnosis not present

## 2022-09-07 DIAGNOSIS — I10 Essential (primary) hypertension: Secondary | ICD-10-CM

## 2022-09-07 LAB — POCT GLYCOSYLATED HEMOGLOBIN (HGB A1C): Hemoglobin A1C: 6.3 % — AB (ref 4.0–5.6)

## 2022-09-07 MED ORDER — DAPAGLIFLOZIN PROPANEDIOL 5 MG PO TABS
5.0000 mg | ORAL_TABLET | Freq: Every day | ORAL | 1 refills | Status: DC
Start: 2022-10-12 — End: 2022-12-22

## 2022-09-07 NOTE — Assessment & Plan Note (Signed)
He is doing phenomenally well.  He is down another 11 pounds since I last saw him.  BMI is now 38.,  Previously was 40.  A1c is down to 6.3 which is fantastic.  He is in general eating more healthy.  Pain attention to carbs.  Try to get an adequate protein.  He and his wife have been walking 3 days/week and occasionally doing some swimming.  Keep up the great work and follow-up in 3 months.

## 2022-09-07 NOTE — Assessment & Plan Note (Signed)
Well controlled. Continue current regimen. Follow up in  6 mo  

## 2022-09-07 NOTE — Progress Notes (Addendum)
Established Patient Office Visit  Subjective   Patient ID: David Cole, male    DOB: 1970-07-31  Age: 52 y.o. MRN: [id number]  Chief Complaint  Patient presents with   Diabetes    HPI  Diabetes - no hypoglycemic events. No wounds or sores that are not healing well. No increased thirst or urination. Checking glucose at home. Taking medications as prescribed without any side effects.  The Ozempic He is now up to 1 mg about the last 3 weeks.  He is tolerating it well he says he has 1 day where he will just have sulfur burps all day long but then it tends to ease off he really has not had any nausea.  He has noticed a little bit more constipation so has had to use a laxative a couple of times.  Hypertension- Pt denies chest pain, SOB, dizziness, or heart palpitations.  Taking meds as directed w/o problems.  Denies medication side effects.    Has been walking 3 days per week.    ROS    Objective:     BP 128/70   Pulse 78   Ht 5\' 9"  (1.753 m)   Wt 260 lb (117.9 kg)   SpO2 96%   BMI 38.40 kg/m    Physical Exam Constitutional:      Appearance: He is well-developed.  HENT:     Head: Normocephalic and atraumatic.  Cardiovascular:     Rate and Rhythm: Normal rate and regular rhythm.     Heart sounds: Normal heart sounds.  Pulmonary:     Effort: Pulmonary effort is normal.     Breath sounds: Normal breath sounds.  Skin:    General: Skin is warm and dry.  Neurological:     Mental Status: He is alert and oriented to person, place, and time.  Psychiatric:        Behavior: Behavior normal.      Results for orders placed or performed in visit on 09/07/22  POCT glycosylated hemoglobin (Hb A1C)  Result Value Ref Range   Hemoglobin A1C 6.3 (A) 4.0 - 5.6 %   HbA1c POC (<> result, manual entry)     HbA1c, POC (prediabetic range)     HbA1c, POC (controlled diabetic range)        The 10-year ASCVD risk score (Arnett DK, et al., 2019) is: 14.8%    Assessment & Plan:    Problem List Items Addressed This Visit       Cardiovascular and Mediastinum   Essential hypertension, benign    Well controlled. Continue current regimen. Follow up in  72mo         Endocrine   Type 2 diabetes mellitus without complication, without long-term current use of insulin (HCC) - Primary    He is doing phenomenally well.  He is down another 11 pounds since I last saw him.  BMI is now 38.,  Previously was 40.  A1c is down to 6.3 which is fantastic.  He is in general eating more healthy.  Pain attention to carbs.  Try to get an adequate protein.  He and his wife have been walking 3 days/week and occasionally doing some swimming.  Keep up the great work and follow-up in 3 months.      Relevant Medications   dapagliflozin propanediol (FARXIGA) 5 MG TABS tablet (Start on 10/12/2022)   Other Relevant Orders   POCT glycosylated hemoglobin (Hb A1C) (Completed)    Return in about 3 months (around 12/15/2022) for  Diabetes follow-up.   I spent 25 minutes on the day of the encounter to include pre-visit record review, face-to-face time with the patient and post visit ordering of test.   Nani Gasser, MD

## 2022-09-07 NOTE — Progress Notes (Signed)
Pt accidentally thew away [place] prescription and would like for a refill to be sent in to his pharmacy. I advised him that this could be done however, not sure that his insurance will cover this.   Farxiga 5 mg is not covered on his insurance plan. Farxiga 10 mg is.

## 2022-09-11 ENCOUNTER — Other Ambulatory Visit: Payer: Self-pay | Admitting: Family Medicine

## 2022-09-11 DIAGNOSIS — E119 Type 2 diabetes mellitus without complications: Secondary | ICD-10-CM

## 2022-09-14 ENCOUNTER — Telehealth: Payer: Self-pay | Admitting: Family Medicine

## 2022-09-14 DIAGNOSIS — E119 Type 2 diabetes mellitus without complications: Secondary | ICD-10-CM

## 2022-09-14 NOTE — Telephone Encounter (Signed)
Patient is requesting 1.0 mg of Ozempic  Please submit to [facility] [place] phone number [phone]

## 2022-09-15 NOTE — Telephone Encounter (Signed)
Patient called again to follow up on status of Oxempic 1.0 mg please advise

## 2022-09-16 MED ORDER — SEMAGLUTIDE (1 MG/DOSE) 4 MG/3ML ~~LOC~~ SOPN
1.0000 mg | PEN_INJECTOR | SUBCUTANEOUS | 1 refills | Status: DC
Start: 2022-09-16 — End: 2022-10-20

## 2022-09-16 NOTE — Telephone Encounter (Signed)
Ok removed 2mg  dose from med list and send new one for 1 mg   Meds ordered this encounter  Medications   Semaglutide, 1 MG/DOSE, 4 MG/3ML SOPN    Sig: Inject 1 mg as directed once a week.    Dispense:  9 mL    Refill:  1

## 2022-09-16 NOTE — Telephone Encounter (Signed)
Task completed. Patient has been updated regarding requested rx sent to the preferred pharmacy.

## 2022-10-01 ENCOUNTER — Ambulatory Visit: Payer: BC Managed Care – PPO | Admitting: Family Medicine

## 2022-10-08 ENCOUNTER — Other Ambulatory Visit: Payer: Self-pay | Admitting: Family Medicine

## 2022-10-16 ENCOUNTER — Telehealth: Payer: Self-pay | Admitting: Family Medicine

## 2022-10-16 NOTE — Telephone Encounter (Signed)
Pt called. His coupon for Ozempic has expired and he is still at the 1.0 dosage. Can he get another coupon?

## 2022-10-19 ENCOUNTER — Telehealth: Payer: Self-pay | Admitting: Family Medicine

## 2022-10-19 ENCOUNTER — Encounter: Payer: Self-pay | Admitting: Family Medicine

## 2022-10-19 DIAGNOSIS — E119 Type 2 diabetes mellitus without complications: Secondary | ICD-10-CM

## 2022-10-19 NOTE — Telephone Encounter (Signed)
Patient called in stating that the prescription for Ozempic a 90 say supply is needed and went to pick it up and was not able to even after switching to 30 day supply at pharmacy. The price change went from $900 to $700, coupon is not working. Please advise.

## 2022-10-19 NOTE — Telephone Encounter (Signed)
Patient called requesting an update on the coupon.  Patient would like to know if he would have to start over on the dosage.

## 2022-10-19 NOTE — Telephone Encounter (Signed)
Please inform pt that he can go onto the [employer] and download coupon for this.

## 2022-10-19 NOTE — Telephone Encounter (Signed)
Ok to continue  same 1 mg dose.  It may cause a little bit more nausea since he has been without the medication for couple of weeks.  We can always send over some nausea meds if you needs it.  Do you  does he need a  prescription sent as well?

## 2022-10-20 ENCOUNTER — Other Ambulatory Visit: Payer: Self-pay | Admitting: Family Medicine

## 2022-10-20 DIAGNOSIS — I10 Essential (primary) hypertension: Secondary | ICD-10-CM

## 2022-10-20 MED ORDER — SEMAGLUTIDE (1 MG/DOSE) 4 MG/3ML ~~LOC~~ SOPN
1.0000 mg | PEN_INJECTOR | SUBCUTANEOUS | 1 refills | Status: DC
Start: 2022-10-20 — End: 2022-12-22

## 2022-10-20 NOTE — Telephone Encounter (Signed)
Called pt and informed him that he could go to the [employer] to download this coupon.

## 2022-10-20 NOTE — Telephone Encounter (Signed)
Pt called.  He is going to need a 90 day supply of the Ozempic in order for the coupon to work. He has called [facility] to confim this. He is going to need nausea medicine once he starts back on the Ozempic.

## 2022-10-23 NOTE — Telephone Encounter (Signed)
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 And the fact that he is unable to use the coupon savings card I don't know what his recourse would be.

## 2022-11-05 ENCOUNTER — Other Ambulatory Visit: Payer: Self-pay | Admitting: Family Medicine

## 2022-12-21 ENCOUNTER — Ambulatory Visit: Payer: BC Managed Care – PPO | Admitting: Family Medicine

## 2022-12-22 ENCOUNTER — Encounter: Payer: Self-pay | Admitting: Family Medicine

## 2022-12-22 DIAGNOSIS — E119 Type 2 diabetes mellitus without complications: Secondary | ICD-10-CM

## 2022-12-22 MED ORDER — SEMAGLUTIDE (1 MG/DOSE) 4 MG/3ML ~~LOC~~ SOPN
1.0000 mg | PEN_INJECTOR | SUBCUTANEOUS | 1 refills | Status: DC
Start: 2022-12-22 — End: 2023-05-03

## 2022-12-22 MED ORDER — DAPAGLIFLOZIN PROPANEDIOL 10 MG PO TABS: 10.0000 mg | ORAL_TABLET | Freq: Every day | ORAL | 1 refills | Status: DC

## 2022-12-22 NOTE — Telephone Encounter (Signed)
Please see message from patient.  Has been taking [place] as 2 tablets daily but in chart as once per day.  He is now out of medication.  Farxiga 5mg  last written 10/12/2022 as #90   And also requesting  Ozempic 1mg   Last written 10/20/2022  Last OV 09/07/2022 Upcoming appt 01/01/2023

## 2023-01-01 ENCOUNTER — Ambulatory Visit: Payer: BC Managed Care – PPO | Admitting: Family Medicine

## 2023-01-01 ENCOUNTER — Encounter: Payer: Self-pay | Admitting: Family Medicine

## 2023-01-01 VITALS — BP 118/72 | HR 97 | Ht 69.0 in | Wt 260.0 lb

## 2023-01-01 DIAGNOSIS — E119 Type 2 diabetes mellitus without complications: Secondary | ICD-10-CM | POA: Diagnosis not present

## 2023-01-01 DIAGNOSIS — G5602 Carpal tunnel syndrome, left upper limb: Secondary | ICD-10-CM | POA: Diagnosis not present

## 2023-01-01 DIAGNOSIS — I10 Essential (primary) hypertension: Secondary | ICD-10-CM | POA: Diagnosis not present

## 2023-01-01 DIAGNOSIS — Z7984 Long term (current) use of oral hypoglycemic drugs: Secondary | ICD-10-CM

## 2023-01-01 DIAGNOSIS — E785 Hyperlipidemia, unspecified: Secondary | ICD-10-CM

## 2023-01-01 LAB — POCT UA - MICROALBUMIN
Albumin/Creatinine Ratio, Urine, POC: <30
Creatinine, POC: 30 mg/dL
Microalbumin Ur, POC: 80 mg/L

## 2023-01-01 LAB — POCT GLYCOSYLATED HEMOGLOBIN (HGB A1C): Hemoglobin A1C: 6.5 % — AB (ref 4.0–5.6)

## 2023-01-01 NOTE — Assessment & Plan Note (Signed)
Well controlled. Continue current regimen. Follow up in 4 months.  Discussed going up on the semaglutide to 2 mg when he runs out of his current prescription he had just picked it up recently.  Again he has not noticed quite the same appetite suppression as when he was on the medication previously before he had to come off and then restart it.  We did discuss continuing to work on portion control making sure that he is eating his vegetables and proteins first and eating the carbs last and then stopping the even before he feels that full sensation

## 2023-01-01 NOTE — Progress Notes (Unsigned)
Established Patient Office Visit  Subjective   Patient ID: David Cole, male    DOB: [birth year]  Age: 52 y.o. MRN: [id number]  Chief Complaint  Patient presents with   Diabetes   Hypertension    HPI   Hypertension- Pt denies chest pain, SOB, dizziness, or heart palpitations.  Taking meds as directed w/o problems.  Denies medication side effects.    Diabetes - no hypoglycemic events. No wounds or sores that are not healing well. No increased thirst or urination. Checking glucose at home. Taking medications as prescribed without any side effects. Now on Farxiga 10 mg.  He is also on 1 mg semaglutide.  Since he came off of it for period because of insurance coverage and then restarted he has not noticed quite the same appetite suppression.  He also c/o of left pins and needles  in the left thumb that radiates over the thumb side of his wrist to his inner elbow and occ past his elbow towards his shoulder.  Sitting with the arm flexed sometimes triggers it but sometimes it can happen even when he is standing up straight and his arms are straight.  It often happens at night and he has to move that arm and shoulder around to get comfortable.  He tends to sleep on that side so he has to get off of it to get it to go away.  Is been going on for a couple of months at this point.  {History (Optional):23778}  ROS    Objective:     There were no vitals taken for this visit. {Vitals History (Optional):23777}  Physical Exam Vitals reviewed.  Constitutional:      Appearance: Normal appearance.  HENT:     Head: Normocephalic.  Pulmonary:     Effort: Pulmonary effort is normal.  Musculoskeletal:     Comments: No swelling of the finger or wrist joints.  Negative Tinel's positive Phalen's.  Negative Finkelstein's.  Neurological:     Mental Status: He is alert and oriented to person, place, and time.  Psychiatric:        Mood and Affect: Mood normal.        Behavior: Behavior normal.       Results for orders placed or performed in visit on 01/01/23  POCT HgB A1C  Result Value Ref Range   Hemoglobin A1C 6.5 (A) 4.0 - 5.6 %   HbA1c POC (<> result, manual entry)     HbA1c, POC (prediabetic range)     HbA1c, POC (controlled diabetic range)    POCT UA - Microalbumin  Result Value Ref Range   Microalbumin Ur, POC 80 mg/L   Creatinine, POC 30 mg/dL   Albumin/Creatinine Ratio, Urine, POC <30     {Labs (Optional):23779}  The 10-year ASCVD risk score (Arnett DK, et al., 2019) is: 14.8%    Assessment & Plan:   Problem List Items Addressed This Visit       Cardiovascular and Mediastinum   Essential hypertension, benign    Well controlled. Continue current regimen. Follow up in 3-4 months.       Relevant Orders   CMP14+EGFR   Lipid Panel With LDL/HDL Ratio     Endocrine   Type 2 diabetes mellitus without complication, without long-term current use of insulin (HCC) - Primary    Well controlled. Continue current regimen. Follow up in 4 months.  Discussed going up on the semaglutide to 2 mg when he runs out of his current prescription he  had just picked it up recently.  Again he has not noticed quite the same appetite suppression as when he was on the medication previously before he had to come off and then restart it.  We did discuss continuing to work on portion control making sure that he is eating his vegetables and proteins first and eating the carbs last and then stopping the even before he feels that full sensation      Relevant Orders   POCT HgB A1C (Completed)   POCT UA - Microalbumin (Completed)   CMP14+EGFR   Lipid Panel With LDL/HDL Ratio     Other   Hyperlipidemia   Other Visit Diagnoses     Carpal tunnel syndrome of left wrist          Left thumb with pins-and-needles radiating to the elbow.  It does not quite fit the classic picture for carpal tunnel so we did discuss a couple of options including seeing our sports med doc or even trying a  cock up splint for a few weeks to see if that seems to be helping or not.  He said he would like to do both so was fitted with a cock up splint today just to wear at night to see if this helps over the next several weeks.  Also recommend that he get in with Dr. Karie Schwalbe for further workup.  Return in about 4 months (around 05/01/2023) for Diabetes follow-up, Hypertension.   I spent 30 minutes on the day of the encounter to include pre-visit record review, face-to-face time with the patient and post visit ordering of test.  Nani Gasser, MD

## 2023-01-01 NOTE — Assessment & Plan Note (Signed)
Well controlled. Continue current regimen. Follow up in  3-4 months.  

## 2023-01-14 ENCOUNTER — Ambulatory Visit: Payer: BC Managed Care – PPO | Admitting: Sports Medicine

## 2023-02-02 ENCOUNTER — Ambulatory Visit: Payer: BC Managed Care – PPO | Admitting: Sports Medicine

## 2023-05-03 ENCOUNTER — Encounter: Payer: Self-pay | Admitting: Family Medicine

## 2023-05-03 ENCOUNTER — Other Ambulatory Visit: Payer: Self-pay | Admitting: Family Medicine

## 2023-05-03 ENCOUNTER — Ambulatory Visit: Payer: BC Managed Care – PPO | Admitting: Family Medicine

## 2023-05-03 VITALS — BP 131/81 | HR 83 | Ht 69.0 in | Wt 255.0 lb

## 2023-05-03 DIAGNOSIS — Z7984 Long term (current) use of oral hypoglycemic drugs: Secondary | ICD-10-CM

## 2023-05-03 DIAGNOSIS — H60542 Acute eczematoid otitis externa, left ear: Secondary | ICD-10-CM | POA: Diagnosis not present

## 2023-05-03 DIAGNOSIS — E119 Type 2 diabetes mellitus without complications: Secondary | ICD-10-CM | POA: Diagnosis not present

## 2023-05-03 DIAGNOSIS — I10 Essential (primary) hypertension: Secondary | ICD-10-CM | POA: Diagnosis not present

## 2023-05-03 DIAGNOSIS — Z1211 Encounter for screening for malignant neoplasm of colon: Secondary | ICD-10-CM

## 2023-05-03 LAB — POCT GLYCOSYLATED HEMOGLOBIN (HGB A1C): Hemoglobin A1C: 8 % — AB (ref 4.0–5.6)

## 2023-05-03 MED ORDER — OZEMPIC (2 MG/DOSE) 8 MG/3ML ~~LOC~~ SOPN: 2.0000 mg | PEN_INJECTOR | SUBCUTANEOUS | 1 refills | Status: DC

## 2023-05-03 MED ORDER — HYDROCORTISONE VALERATE 0.2 % EX CREA: 1.0000 | TOPICAL_CREAM | Freq: Every day | CUTANEOUS | 1 refills | Status: DC | PRN

## 2023-05-03 NOTE — Patient Instructions (Addendum)
 Please schedule your eye exam  K to apply the steroid cream to your ear daily for 2 weeks and then after that okay to decrease down to 2-3 times per week to just kind to help keep the dead skin and scale under good control.  Let me know if you do not feel like it is helping.  Continue to work on healthy food choices and try to get at least 30 minutes of exercise 5 days a week.

## 2023-05-03 NOTE — Progress Notes (Signed)
 Established Patient Office Visit  Subjective  Patient ID: David Cole, male    DOB: [birth year]  Age: 53 y.o. MRN: [id number]  Chief Complaint  Patient presents with   Diabetes    HPI  For follow-up of his diabetes.  He has not had colon cancer screening.    ROS    Objective:     BP 131/81   Pulse 83   Ht 5\' 9"  (1.753 m)   Wt 255 lb (115.7 kg)   SpO2 96%   BMI 37.66 kg/m    Physical Exam Vitals and nursing note reviewed.  Constitutional:      Appearance: Normal appearance.  HENT:     Head: Normocephalic and atraumatic.     Right Ear: Tympanic membrane, ear canal and external ear normal.     Left Ear: Tympanic membrane and external ear normal.     Ears:     Comments: Dry thick scal at opening of the left ear.  Eyes:     Conjunctiva/sclera: Conjunctivae normal.  Cardiovascular:     Rate and Rhythm: Normal rate and regular rhythm.  Pulmonary:     Effort: Pulmonary effort is normal.     Breath sounds: Normal breath sounds.  Skin:    General: Skin is warm and dry.  Neurological:     Mental Status: He is alert.  Psychiatric:        Mood and Affect: Mood normal.      Results for orders placed or performed in visit on 05/03/23  POCT HgB A1C  Result Value Ref Range   Hemoglobin A1C 8.0 (A) 4.0 - 5.6 %   HbA1c POC (<> result, manual entry)     HbA1c, POC (prediabetic range)     HbA1c, POC (controlled diabetic range)        The 10-year ASCVD risk score (Arnett DK, et al., 2019) is: 15.4%    Assessment & Plan:   Problem List Items Addressed This Visit       Cardiovascular and Mediastinum   Essential hypertension, benign   BP borderline today.  Continue Tribenzor and metoprolol         Endocrine   Type 2 diabetes mellitus without complication, without long-term current use of insulin (HCC) - Primary   Uncontrolled. A1C went from 6.9-8.0 he has noticed that the medication does not seem to be controlling his appetite quite as well and he does  feel like he may be got into some sweets around the holidays.  Just encouraged him to get back on track with diet eat his protein and vegetables first.  Encouraged him to work on getting in regular exercise.  Work on increase Ozempic to 2 mg.  Follow-up in 3 months.  Will get updated blood work today.      Relevant Medications   Semaglutide, 2 MG/DOSE, (OZEMPIC, 2 MG/DOSE,) 8 MG/3ML SOPN   Other Relevant Orders   POCT HgB A1C (Completed)     Other   Morbid obesity (HCC)   He has lost 5 pounds since he was last here but his A1c went up significantly.  Just encouraged him to continue with the Ozempic work on a bump to 2 mg and continue to work on [employer].      Relevant Medications   Semaglutide, 2 MG/DOSE, (OZEMPIC, 2 MG/DOSE,) 8 MG/3ML SOPN   Other Visit Diagnoses       Dermatitis of ear canal, left       Relevant Medications  hydrocortisone valerate cream (WESTCORT) 0.2 %     Screen for colon cancer       Relevant Orders   Cologuard      Encouraged him to schedule his eye exam for this year he is just now due.  Also discussed need for colon cancer screening   Return in about 3 months (around 08/03/2023) for Diabetes follow-up.    Nani Gasser, MD

## 2023-05-03 NOTE — Assessment & Plan Note (Signed)
 BP borderline today.  Continue Tribenzor and metoprolol

## 2023-05-03 NOTE — Assessment & Plan Note (Signed)
 Uncontrolled. A1C went from 6.9-8.0 he has noticed that the medication does not seem to be controlling his appetite quite as well and he does feel like he may be got into some sweets around the holidays.  Just encouraged him to get back on track with diet eat his protein and vegetables first.  Encouraged him to work on getting in regular exercise.  Work on increase Ozempic to 2 mg.  Follow-up in 3 months.  Will get updated blood work today.

## 2023-05-03 NOTE — Assessment & Plan Note (Signed)
 He has lost 5 pounds since he was last here but his A1c went up significantly.  Just encouraged him to continue with the Ozempic work on a bump to 2 mg and continue to work on [employer].

## 2023-05-04 ENCOUNTER — Encounter: Payer: Self-pay | Admitting: Family Medicine

## 2023-05-04 LAB — CMP14+EGFR
ALT: 49 IU/L — ABNORMAL HIGH (ref 0–44)
AST: 35 IU/L (ref 0–40)
Albumin: 4.6 g/dL (ref 3.8–4.9)
Alkaline Phosphatase: 114 IU/L (ref 44–121)
BUN/Creatinine Ratio: 14 (ref 9–20)
BUN: 14 mg/dL (ref 6–24)
Bilirubin Total: 0.6 mg/dL (ref 0.0–1.2)
CO2: 22 mmol/L (ref 20–29)
Calcium: 9.7 mg/dL (ref 8.7–10.2)
Chloride: 100 mmol/L (ref 96–106)
Creatinine, Ser: 0.99 mg/dL (ref 0.76–1.27)
Globulin, Total: 2.9 g/dL (ref 1.5–4.5)
Glucose: 167 mg/dL — ABNORMAL HIGH (ref 70–99)
Potassium: 3.8 mmol/L (ref 3.5–5.2)
Sodium: 142 mmol/L (ref 134–144)
Total Protein: 7.5 g/dL (ref 6.0–8.5)
eGFR: 92 mL/min/{1.73_m2} (ref >59)

## 2023-05-04 LAB — LIPID PANEL WITH LDL/HDL RATIO
Cholesterol, Total: 139 mg/dL (ref 100–199)
HDL: 31 mg/dL — ABNORMAL LOW (ref >39)
LDL Chol Calc (NIH): 55 mg/dL (ref 0–99)
LDL/HDL Ratio: 1.8 ratio (ref 0.0–3.6)
Triglycerides: 348 mg/dL — ABNORMAL HIGH (ref 0–149)
VLDL Cholesterol Cal: 53 mg/dL — ABNORMAL HIGH (ref 5–40)

## 2023-05-04 NOTE — Progress Notes (Signed)
 Hi David Cole,  Your glucose AST liver enzyme looks better and your ALT is trending down. Great work!!  Triglycerides also look better which is fantastic and LDL is at goal.

## 2023-05-31 DIAGNOSIS — Z1211 Encounter for screening for malignant neoplasm of colon: Secondary | ICD-10-CM | POA: Diagnosis not present

## 2023-06-04 ENCOUNTER — Encounter: Payer: Self-pay | Admitting: Family Medicine

## 2023-06-04 LAB — COLOGUARD: COLOGUARD: NEGATIVE

## 2023-06-04 NOTE — Progress Notes (Signed)
 Great news! Your Cologuard test is negative.  Recommend repeat colon cancer screening in 3 years.

## 2023-06-07 ENCOUNTER — Ambulatory Visit: Payer: Self-pay

## 2023-06-07 NOTE — Telephone Encounter (Signed)
 Copied from CRM [id number]. Topic: Clinical - Red Word Triage >> Jun 07, 2023  2:28 PM Alvino Blood C wrote: Red Word that prompted transfer to Nurse Triage: Patient states for the last few days he been having pain in his right ear, he is unsure if it has to do with his tooth he describes the pain level as a 3.  Chief Complaint: right ear pain Symptoms: 3/10 ear pain, Right ear fullness, feels warm to touch, ear redness, right jaw discomfort Frequency: x 2 -3  Pertinent Negatives: Patient denies n/v or headache Disposition: [] ED /[] Urgent Care (no appt availability in office) / [x] Appointment(In office/virtual)/ []  [facility]/ [] Home Care/ [] Refused Recommended Disposition /[]  Mobile Bus/ []  Follow-up with PCP Additional Notes: pt stated wears CPAP and strap goes across the ear: could be related to discomfort. Pt has a bad tooth & could be related to jaw pain.  Reason for Disposition  White, yellow, or green discharge    Schedule appt on Wed/ 3/10 right ear pain: no drainage, pt c/o fullness right jaw discomfort, ear redness  Answer Assessment - Initial Assessment Questions 1. LOCATION: "Which ear is involved?"     Right ear 2. ONSET: "When did the ear start hurting"      X 2 - 3 days 3. SEVERITY: "How bad is the pain?"  (Scale 1-10; mild, moderate or severe)   - MILD (1-3): doesn't interfere with normal activities    - MODERATE (4-7): interferes with normal activities or awakens from sleep    - SEVERE (8-10): excruciating pain, unable to do any normal activities      Mild 4. URI SYMPTOMS: "Do you have a runny nose or cough?"     N/a 5. FEVER: "Do you have a fever?" If Yes, ask x  2 to 3 days: "What is your temperature, how was it measured, and when did it start?"     no 6. CAUSE: "Have you been swimming recently?", "How often do you use Q-TIPS?", "Have you had any recent air travel or scuba diving?"     no 7. OTHER SYMPTOMS: "Do you have any other symptoms?" (e.g.,  headache, stiff neck, dizziness, vomiting, runny nose, decreased hearing)     Right ear fullness, feels warm to touch, ear reddness 8. PREGNANCY: "Is there any chance you are pregnant?" "When was your last menstrual period?"     N/a  Protocols used: Davina Poke

## 2023-06-07 NOTE — Telephone Encounter (Signed)
 Patient scheduled for 06/09/23 with Dr. Linford Arnold

## 2023-06-09 ENCOUNTER — Ambulatory Visit: Admitting: Family Medicine

## 2023-06-09 ENCOUNTER — Encounter: Payer: Self-pay | Admitting: Family Medicine

## 2023-06-09 VITALS — BP 134/79 | HR 67 | Ht 69.0 in | Wt 254.0 lb

## 2023-06-09 DIAGNOSIS — Z7984 Long term (current) use of oral hypoglycemic drugs: Secondary | ICD-10-CM

## 2023-06-09 DIAGNOSIS — E119 Type 2 diabetes mellitus without complications: Secondary | ICD-10-CM | POA: Diagnosis not present

## 2023-06-09 DIAGNOSIS — I10 Essential (primary) hypertension: Secondary | ICD-10-CM

## 2023-06-09 DIAGNOSIS — H9201 Otalgia, right ear: Secondary | ICD-10-CM

## 2023-06-09 MED ORDER — OZEMPIC (2 MG/DOSE) 8 MG/3ML ~~LOC~~ SOPN: 2.0000 mg | PEN_INJECTOR | SUBCUTANEOUS | 1 refills | Status: DC

## 2023-06-09 NOTE — Patient Instructions (Signed)
 Recommend a trial of Flonase or Nasonex.  Start with 2 sprays in each nostril.  Tilt your head forward and gently spray into your nose towards the outer part of your nose.  Please do this daily for the next 10 to 14 days.  If you are not noticing some improvement after 5 days please let m  Start Aleve 1 tab twice a day for the next 5 days to help reduce inflammation.

## 2023-06-09 NOTE — Assessment & Plan Note (Signed)
BP ok today

## 2023-06-09 NOTE — Progress Notes (Signed)
 Acute Office Visit  Subjective:     Patient ID: David Cole, male    DOB: 02-07-1971, 52 y.o.   MRN: [id number]  Chief Complaint  Patient presents with   Ear Pain    Pt states that this pain started about 3-4 days ago. It is affecting his R ear and he feels that his lymph nodes are also swollen on the R side.     HPI Patient is in today for   Pt states that this pain started about 3-4 days ago. It is affecting his R ear and he feels that his lymph nodes are also swollen on the R side. Pain radiating into jaw and even towards eye  and down into neck.  No drainage. No recent URI. No fver.     ROS      Objective:    BP 134/79   Pulse 67   Ht 5\' 9"  (1.753 m)   Wt 254 lb (115.2 kg)   SpO2 97%   BMI 37.51 kg/m    Physical Exam Constitutional:      Appearance: Normal appearance.  HENT:     Head: Normocephalic and atraumatic.     Right Ear: Tympanic membrane, ear canal and external ear normal. There is no impacted cerumen.     Left Ear: Tympanic membrane, ear canal and external ear normal. There is no impacted cerumen.     Nose: Nose normal.     Mouth/Throat:     Pharynx: Oropharynx is clear.  Eyes:     Conjunctiva/sclera: Conjunctivae normal.  Cardiovascular:     Rate and Rhythm: Normal rate and regular rhythm.  Pulmonary:     Effort: Pulmonary effort is normal.     Breath sounds: Normal breath sounds.  Musculoskeletal:     Cervical back: Neck supple. No tenderness.  Lymphadenopathy:     Cervical: No cervical adenopathy.  Skin:    General: Skin is warm and dry.  Neurological:     Mental Status: He is alert and oriented to person, place, and time.  Psychiatric:        Mood and Affect: Mood normal.     No results found for any visits on 06/09/23.      Assessment & Plan:   Problem List Items Addressed This Visit       Cardiovascular and Mediastinum   Essential hypertension, benign   BP ok today.         Endocrine   Type 2 diabetes mellitus  without complication, without long-term current use of insulin (HCC)   Needs rx sent to Mail order.       Relevant Medications   Semaglutide, 2 MG/DOSE, (OZEMPIC, 2 MG/DOSE,) 8 MG/3ML SOPN   Other Visit Diagnoses       Right ear pain    -  Primary      Right ear pain - most consistent with eustachian tube dysfunction. Recommend trial of nasal steroid spray. If not improving then trial of oral steroid.  He has also reached out to dentist. If not improving consider TMJ vs trigeminal neuralgia.   Recommend a trial of Flonase or Nasonex.  Start with 2 sprays in each nostril.  Tilt your head forward and gently spray into your nose towards the outer part of your nose.  Please do this daily for the next 10 to 14 days.  If you are not noticing some improvement after 5 days please let m  Start Aleve 1 tab twice a day for  the next 5 days to help reduce inflammation.  Meds ordered this encounter  Medications   Semaglutide, 2 MG/DOSE, (OZEMPIC, 2 MG/DOSE,) 8 MG/3ML SOPN    Sig: Inject 2 mg into the skin every 7 (seven) days.    Dispense:  9 mL    Refill:  1    No follow-ups on file.  Nani Gasser, MD

## 2023-06-09 NOTE — Assessment & Plan Note (Signed)
 Needs rx sent to Mail order.

## 2023-06-25 ENCOUNTER — Other Ambulatory Visit: Payer: Self-pay | Admitting: Family Medicine

## 2023-06-25 DIAGNOSIS — E119 Type 2 diabetes mellitus without complications: Secondary | ICD-10-CM

## 2023-06-25 MED ORDER — DAPAGLIFLOZIN PROPANEDIOL 10 MG PO TABS: 10.0000 mg | ORAL_TABLET | Freq: Every day | ORAL | 1 refills | Status: DC

## 2023-06-25 NOTE — Telephone Encounter (Signed)
 Copied from CRM [id number]. Topic: Clinical - Medication Refill >> Jun 25, 2023 10:49 AM Hamdi H wrote: Most Recent Primary Care Visit:  Provider: METHENEY, CATHERINE D  Department: [facility]  Visit Type: ACUTE  Date: 06/09/2023  Medication: dapagliflozin  propanediol (FARXIGA ) 10 MG TABS tablet   Has the patient contacted their pharmacy? Yes (Agent: If no, request that the patient contact the pharmacy for the refill. If patient does not wish to contact the pharmacy document the reason why and proceed with request.) (Agent: If yes, when and what did the pharmacy advise?) No refills left  Is this the correct pharmacy for this prescription? Yes If no, delete pharmacy and type the correct one.  This is the patient's preferred pharmacy:  [id number] - [place], [place] - [address] AT [facility] [address] [place] [address] Phone: [phone] Fax: [phone]   Has the prescription been filled recently? No  Is the patient out of the medication? Yes  Has the patient been seen for an appointment in the last year OR does the patient have an upcoming appointment? Yes  Can we respond through MyChart? Yes  Agent: Please be advised that Rx refills may take up to 3 business days. We ask that you follow-up with your pharmacy.

## 2023-08-31 ENCOUNTER — Other Ambulatory Visit: Payer: Self-pay | Admitting: Family Medicine

## 2023-09-02 ENCOUNTER — Ambulatory Visit: Admitting: Family Medicine

## 2023-10-01 ENCOUNTER — Other Ambulatory Visit: Payer: Self-pay | Admitting: Family Medicine

## 2023-10-05 DIAGNOSIS — E119 Type 2 diabetes mellitus without complications: Secondary | ICD-10-CM | POA: Diagnosis not present

## 2023-10-15 ENCOUNTER — Other Ambulatory Visit: Payer: Self-pay | Admitting: Family Medicine

## 2023-10-15 DIAGNOSIS — I10 Essential (primary) hypertension: Secondary | ICD-10-CM

## 2023-10-15 LAB — HM DIABETES EYE EXAM

## 2023-10-19 ENCOUNTER — Encounter: Payer: Self-pay | Admitting: Family Medicine

## 2023-10-19 ENCOUNTER — Ambulatory Visit: Admitting: Family Medicine

## 2023-10-19 VITALS — BP 131/76 | HR 94 | Ht 69.0 in | Wt 251.5 lb

## 2023-10-19 DIAGNOSIS — I10 Essential (primary) hypertension: Secondary | ICD-10-CM

## 2023-10-19 DIAGNOSIS — Z7985 Long-term (current) use of injectable non-insulin antidiabetic drugs: Secondary | ICD-10-CM

## 2023-10-19 DIAGNOSIS — Z7984 Long term (current) use of oral hypoglycemic drugs: Secondary | ICD-10-CM

## 2023-10-19 DIAGNOSIS — J310 Chronic rhinitis: Secondary | ICD-10-CM

## 2023-10-19 DIAGNOSIS — N529 Male erectile dysfunction, unspecified: Secondary | ICD-10-CM

## 2023-10-19 DIAGNOSIS — E119 Type 2 diabetes mellitus without complications: Secondary | ICD-10-CM | POA: Diagnosis not present

## 2023-10-19 DIAGNOSIS — R0981 Nasal congestion: Secondary | ICD-10-CM

## 2023-10-19 LAB — POCT GLYCOSYLATED HEMOGLOBIN (HGB A1C): Hemoglobin A1C: 7.4 % — AB (ref 4.0–5.6)

## 2023-10-19 MED ORDER — TADALAFIL 20 MG PO TABS: 10.0000 mg | ORAL_TABLET | ORAL | 5 refills | Status: DC | PRN

## 2023-10-19 NOTE — Progress Notes (Signed)
 Established Patient Office Visit  Subjective  Patient ID: David Cole, male    DOB: [birth year]  Age: 53 y.o. MRN: [id number]  Chief Complaint  Patient presents with   Diabetes   Hypertension    HPI  Follow-up diabetes.  Had eye exam about 2 weeks ago done at [facility] locally.  Discussed the use of AI scribe software for clinical note transcription with the patient, who gave verbal consent to proceed.  History of Present Illness David Cole is a 53 year old male with type 2 diabetes who presents for follow-up of his diabetes management.  Glycemic control in type 2 diabetes - A1c improved from 8.0% in March to 7.4% currently, not yet at previous level of 6.5% - Focusing on dietary changes including increased vegetables, lean proteins, and carbohydrate management - Takes GLP-1 receptor agonist (Ozempic  2 mg) injection every Wednesday for medication adherence - Current medications: Ozempic  2 mg weekly, Farxiga  10 mg daily, metformin  - Engages in physical activity by walking two to three times per week (approximately two miles each session) and performing landscaping work every two weeks  Nasal congestion associated with eating - Experiences nasal congestion during meals - Attributes congestion to eating quickly, temperature changes, or sugar intake - Congestion typically resolves within one 30-60 min after eating  Acute toe pain and swelling - Recent episode of toe pain and swelling following consumption of ribs - Symptoms resolved within two days - No family history of gout, but wife has gout flares  Ophthalmologic evaluation - Recent eye examination performed at [facility]  25th anniversary with his wife coming up and he would like to have a med to help with ED. Took something years ago.      ROS    Objective:     BP 131/76   Pulse 94   Ht 5' 9 (1.753 m)   Wt 251 lb 8 oz (114.1 kg)   SpO2 97%   BMI 37.14 kg/m    Physical Exam Vitals and nursing note  reviewed.  Constitutional:      Appearance: Normal appearance.  HENT:     Head: Normocephalic and atraumatic.  Eyes:     Conjunctiva/sclera: Conjunctivae normal.  Cardiovascular:     Rate and Rhythm: Normal rate and regular rhythm.  Pulmonary:     Effort: Pulmonary effort is normal.     Breath sounds: Normal breath sounds.  Skin:    General: Skin is warm and dry.  Neurological:     Mental Status: He is alert.  Psychiatric:        Mood and Affect: Mood normal.      Results for orders placed or performed in visit on 10/19/23  POCT HgB A1C  Result Value Ref Range   Hemoglobin A1C 7.4 (A) 4.0 - 5.6 %   HbA1c POC (<> result, manual entry)     HbA1c, POC (prediabetic range)     HbA1c, POC (controlled diabetic range)        The 10-year ASCVD risk score (Arnett DK, et al., 2019) is: 10.6%    Assessment & Plan:   Problem List Items Addressed This Visit       Cardiovascular and Mediastinum   Essential hypertension, benign   Relevant Medications   tadalafil  (CIALIS ) 20 MG tablet   Other Relevant Orders   CMP14+EGFR     Endocrine   Type 2 diabetes mellitus without complication, without long-term current use of insulin (HCC) - Primary   Relevant Orders  POCT HgB A1C (Completed)   CMP14+EGFR   Other Visit Diagnoses       Erectile disorder       Relevant Medications   tadalafil  (CIALIS ) 20 MG tablet     Nasal congestion         Gustatory rhinitis          Assessment and Plan Assessment & Plan Type 2 diabetes mellitus A1c improved from 8.0% to 7.4%. Goal is 6.5%. - Continue Semaglutide  2 mg weekly, Farxiga  10 mg, and Metformin . - Encourage dietary modifications: prioritize protein and vegetables before carbohydrates. - Encourage regular exercise, aiming for 2-3 times a week or more if possible. - Reassess A1c in three months. Consider switching to Mounjaro if A1c is not below 7.0%.  Essential hypertension No changes in management. - Continue Metoprolol  and  Tribenzor as needed.  Erectile dysfunction Discussed Cialis  as a treatment option. Previous medications had limited success. - Prescribe Cialis  as needed, avoiding high-fat meals before taking. - Monitor for side effects such as nasal congestion and vision changes.  Gustatory rhinitis Experiences nasal congestion when eating. - Occ uses Nasonex nasal spray to alleviate symptoms. - Monitor for patterns with specific foods that may trigger symptoms.  Constipation  Suspected gout flare, right great toe Experienced a flare after consuming ribs, resolved in two days. No family history of gout, but his wife is susceptible to flares. - Avoid trigger foods such as ribs. - Monitor for future occurrences of gout-like symptoms.    Return in about 3 months (around 01/26/2024) for Diabetes follow-up, Hypertension.    David Byars, MD

## 2023-10-19 NOTE — Patient Instructions (Signed)
-   Encourage dietary modifications: prioritize protein and vegetables before carbohydrates. - Encourage regular exercise, aiming for 2-3 times a week or more if possible. - Reassess A1c in three months. Consider switching to Mounjaro if A1c is not below 7.0%.

## 2023-10-20 ENCOUNTER — Ambulatory Visit: Payer: Self-pay | Admitting: Family Medicine

## 2023-10-20 LAB — CMP14+EGFR
ALT: 40 IU/L (ref 0–44)
AST: 28 IU/L (ref 0–40)
Albumin: 4.5 g/dL (ref 3.8–4.9)
Alkaline Phosphatase: 100 IU/L (ref 44–121)
BUN/Creatinine Ratio: 14 (ref 9–20)
BUN: 14 mg/dL (ref 6–24)
Bilirubin Total: 0.6 mg/dL (ref 0.0–1.2)
CO2: 22 mmol/L (ref 20–29)
Calcium: 9.6 mg/dL (ref 8.7–10.2)
Chloride: 104 mmol/L (ref 96–106)
Creatinine, Ser: 1 mg/dL (ref 0.76–1.27)
Globulin, Total: 2.8 g/dL (ref 1.5–4.5)
Glucose: 139 mg/dL — ABNORMAL HIGH (ref 70–99)
Potassium: 4 mmol/L (ref 3.5–5.2)
Sodium: 144 mmol/L (ref 134–144)
Total Protein: 7.3 g/dL (ref 6.0–8.5)
eGFR: 90 mL/min/1.73 (ref >59)

## 2023-10-20 NOTE — Progress Notes (Signed)
 Marcey, great news the ALT liver enzyme is down to normal which is fantastic just continuing to work on healthy Mediterranean style diet in addition to 30 minutes of exercise 5 days a week to improve inflammation in the body and the liver.

## 2023-10-25 ENCOUNTER — Encounter: Payer: Self-pay | Admitting: Family Medicine

## 2023-10-25 ENCOUNTER — Other Ambulatory Visit: Payer: Self-pay | Admitting: Family Medicine

## 2023-10-25 DIAGNOSIS — L509 Urticaria, unspecified: Secondary | ICD-10-CM

## 2023-11-01 ENCOUNTER — Other Ambulatory Visit: Payer: Self-pay | Admitting: Family Medicine

## 2023-11-02 ENCOUNTER — Encounter: Payer: Self-pay | Admitting: Sports Medicine

## 2023-11-05 NOTE — Telephone Encounter (Signed)
Orders Placed This Encounter  Procedures   Ambulatory referral to Allergy    Referral Priority:   Routine    Referral Type:   Allergy Testing    Referral Reason:   Specialty Services Required    Requested Specialty:   Allergy    Number of Visits Requested:   1    

## 2023-11-05 NOTE — Addendum Note (Signed)
 Addended by: Jonathan Kirkendoll D on: 11/05/2023 04:38 PM   Modules accepted: Orders

## 2023-12-03 ENCOUNTER — Other Ambulatory Visit: Payer: Self-pay | Admitting: Family Medicine

## 2023-12-03 ENCOUNTER — Telehealth: Payer: Self-pay

## 2023-12-03 DIAGNOSIS — E119 Type 2 diabetes mellitus without complications: Secondary | ICD-10-CM

## 2023-12-03 MED ORDER — TIRZEPATIDE 10 MG/0.5ML ~~LOC~~ SOAJ: 10.0000 mg | SUBCUTANEOUS | 0 refills | Status: DC

## 2023-12-03 NOTE — Telephone Encounter (Signed)
 Copied from CRM #[id number]. Topic: Clinical - Prescription Issue >> Dec 03, 2023  2:42 PM Antwanette L wrote: Reason for CRM: The patient is calling to report that [facility] is currently out of tirzepatide (Mounjaro) 10 mg/0.5 mL pens. The patient is requesting that a new prescription order be sent to [facility] instead.   Pharmacy Details [address] [place] [address] Phone: [phone] Fax: [phone]

## 2023-12-03 NOTE — Telephone Encounter (Signed)
 Copied from CRM [id number]. Topic: Clinical - Medication Question >> Dec 02, 2023  3:09 PM Sophia H wrote: Reason for CRM: Patient states provider advised she would be switching his Ozempic  to another medication. Based off of office visit note  Consider switching to Mounjaro if A1c is not below 7.0%. Patient would like to have that sent in to pharmacy. Please advise # [phone]   [facility] - [place], [place] - [address]

## 2023-12-03 NOTE — Telephone Encounter (Signed)
 Meds ordered this encounter  Medications   tirzepatide (MOUNJARO) 10 MG/0.5ML Pen    Sig: Inject 10 mg into the skin once a week.    Dispense:  6 mL    Refill:  0    In place of ozempic 

## 2023-12-06 MED ORDER — TIRZEPATIDE 10 MG/0.5ML ~~LOC~~ SOAJ: 10.0000 mg | SUBCUTANEOUS | 0 refills | Status: DC

## 2023-12-06 NOTE — Telephone Encounter (Signed)
Patient sent message via Mychart.

## 2023-12-28 ENCOUNTER — Other Ambulatory Visit: Payer: Self-pay | Admitting: Family Medicine

## 2023-12-28 DIAGNOSIS — E119 Type 2 diabetes mellitus without complications: Secondary | ICD-10-CM

## 2024-02-01 ENCOUNTER — Ambulatory Visit: Admitting: Family Medicine

## 2024-02-01 ENCOUNTER — Encounter: Payer: Self-pay | Admitting: Family Medicine

## 2024-02-01 VITALS — BP 120/74 | HR 85 | Ht 69.0 in | Wt 247.0 lb

## 2024-02-01 DIAGNOSIS — E1169 Type 2 diabetes mellitus with other specified complication: Secondary | ICD-10-CM

## 2024-02-01 DIAGNOSIS — E119 Type 2 diabetes mellitus without complications: Secondary | ICD-10-CM | POA: Diagnosis not present

## 2024-02-01 DIAGNOSIS — G4733 Obstructive sleep apnea (adult) (pediatric): Secondary | ICD-10-CM | POA: Diagnosis not present

## 2024-02-01 DIAGNOSIS — I10 Essential (primary) hypertension: Secondary | ICD-10-CM

## 2024-02-01 DIAGNOSIS — E785 Hyperlipidemia, unspecified: Secondary | ICD-10-CM

## 2024-02-01 DIAGNOSIS — K5903 Drug induced constipation: Secondary | ICD-10-CM

## 2024-02-01 LAB — POCT GLYCOSYLATED HEMOGLOBIN (HGB A1C): Hemoglobin A1C: 6.7 % — AB (ref 4.0–5.6)

## 2024-02-01 NOTE — Assessment & Plan Note (Signed)
 Type 2 diabetes mellitus Improved glycemic control with A1c reduced to 6.7%, below target of 7.0%. - Continue current diabetes management plan. - Encouraged regular physical activity and dietary modifications, including portion control and prioritizing protein and vegetables over carbohydrates. - Continue monitoring blood glucose levels regularly.

## 2024-02-01 NOTE — Progress Notes (Signed)
 Established Patient Office Visit  Patient ID: David Cole, male    DOB: [birth year]  Age: 53 y.o. MRN: [id number] PCP: Alvan Dorothyann BIRCH, MD  Chief Complaint  Patient presents with   Diabetes    Subjective:     HPI  Discussed the use of AI scribe software for clinical note transcription with the patient, who gave verbal consent to proceed.  History of Present Illness David Cole is a 53 year old male with type 2 diabetes who presents for follow-up of his diabetes management.  Glycemic control and lifestyle modification - Type 2 diabetes with significant improvement in glycemic control: HbA1c decreased from 8.0% (previous) to 7.4% (August) to 6.7% (current) - Increased physical activity, including walking more frequently with his wife - Dietary modifications include switching from soda to flavored seltzer and focusing on portion control - No soda consumption for the past one to two months  Constipation associated with mounjaro  - Constipation temporally associated with Mounjaro  use - Symptoms include sensation of fullness and mild discomfort in the lower abdomen, persisting for several days - Miralax mixed with coffee used for two consecutive days without relief - No use of other stool softeners  Obstructive sleep apnea and cpap use - No new CPAP supplies purchased in over a year due to cost concerns - Current supplies are being reused and cleaned - Sleep duration is five to six hours per night - CPAP mask fit issues require tight adjustment, resulting in discomfort     ROS    Objective:     BP 120/74   Pulse 85   Ht 5' 9 (1.753 m)   Wt 247 lb 0.6 oz (112.1 kg)   SpO2 100%   BMI 36.48 kg/m    Physical Exam Vitals and nursing note reviewed.  Constitutional:      Appearance: Normal appearance.  HENT:     Head: Normocephalic and atraumatic.  Eyes:     Conjunctiva/sclera: Conjunctivae normal.  Cardiovascular:     Rate and Rhythm: Normal rate  and regular rhythm.  Pulmonary:     Effort: Pulmonary effort is normal.     Breath sounds: Normal breath sounds.  Skin:    General: Skin is warm and dry.  Neurological:     Mental Status: He is alert.  Psychiatric:        Mood and Affect: Mood normal.      Results for orders placed or performed in visit on 02/01/24  POCT HgB A1C  Result Value Ref Range   Hemoglobin A1C 6.7 (A) 4.0 - 5.6 %   HbA1c POC (<> result, manual entry)     HbA1c, POC (prediabetic range)     HbA1c, POC (controlled diabetic range)        The 10-year ASCVD risk score (Arnett DK, et al., 2019) is: 9.1%    Assessment & Plan:   Problem List Items Addressed This Visit       Cardiovascular and Mediastinum   Essential hypertension, benign - Primary   Essential hypertension Essential hypertension managed with medication, crucial for reducing cardiovascular risk. - Continue current antihypertensive medication regimen. - Monitor blood pressure regularly.        Respiratory   OSA on CPAP   Obstructive sleep apnea Chronic obstructive sleep apnea with difficulty obtaining new CPAP supplies due to cost, affecting sleep quality. - Consider purchasing CPAP supplies online to reduce costs. - Evaluate insurance coverage and HSA options for purchasing supplies. - Sent an order to  [facility] for CPAP supplies if needed.        Endocrine   Hyperlipidemia associated with type 2 diabetes mellitus (HCC)   Type 2 diabetes mellitus Improved glycemic control with A1c reduced to 6.7%, below target of 7.0%. - Continue current diabetes management plan. - Encouraged regular physical activity and dietary modifications, including portion control and prioritizing protein and vegetables over carbohydrates. - Continue monitoring blood glucose levels regularly.      Relevant Orders   Urine Microalbumin w/creat. ratio   POCT HgB A1C (Completed)   Other Visit Diagnoses       Type 2 diabetes mellitus without  complication, without long-term current use of insulin (HCC)       Relevant Orders   Urine Microalbumin w/creat. ratio   POCT HgB A1C (Completed)     Drug-induced constipation           Assessment and Plan Assessment & Plan   Constipation Chronic constipation likely due to Mounjaro  use, with prolonged intervals between bowel movements and discomfort. - Increase Miralax to twice daily, starting with a full capful in the morning and evening, for several days. - After regular bowel movements, reduce Miralax to half a capful daily.     Return in about 3 months (around 05/08/2024) for Diabetes follow-up.    [name], MD [facility] at [facility]

## 2024-02-01 NOTE — Assessment & Plan Note (Signed)
 Essential hypertension Essential hypertension managed with medication, crucial for reducing cardiovascular risk. - Continue current antihypertensive medication regimen. - Monitor blood pressure regularly.

## 2024-02-01 NOTE — Assessment & Plan Note (Signed)
 Obstructive sleep apnea Chronic obstructive sleep apnea with difficulty obtaining new CPAP supplies due to cost, affecting sleep quality. - Consider purchasing CPAP supplies online to reduce costs. - Evaluate insurance coverage and HSA options for purchasing supplies. - Sent an order to [facility] for CPAP supplies if needed.

## 2024-02-01 NOTE — Patient Instructions (Signed)
 Start miralax twice a day until soft bowel movement. May take 5 days or more.  Once stools are soft start 1/2 capful of miralax once a day for maintenance.

## 2024-02-02 LAB — MICROALBUMIN / CREATININE URINE RATIO
Creatinine, Urine: 140 mg/dL
Microalb/Creat Ratio: 8 mg/g{creat} (ref 0–29)
Microalbumin, Urine: 10.5 ug/mL

## 2024-02-03 ENCOUNTER — Ambulatory Visit: Payer: Self-pay | Admitting: Family Medicine

## 2024-02-03 NOTE — Progress Notes (Signed)
 Hi Steve, no excess protein in the urine which is great news.

## 2024-02-15 ENCOUNTER — Other Ambulatory Visit: Payer: Self-pay | Admitting: Family Medicine

## 2024-02-15 DIAGNOSIS — G4733 Obstructive sleep apnea (adult) (pediatric): Secondary | ICD-10-CM

## 2024-02-15 MED ORDER — AMBULATORY NON FORMULARY MEDICATION: 99 refills | Status: AC

## 2024-03-03 ENCOUNTER — Other Ambulatory Visit: Payer: Self-pay | Admitting: Family Medicine

## 2024-03-03 DIAGNOSIS — E119 Type 2 diabetes mellitus without complications: Secondary | ICD-10-CM

## 2024-03-15 ENCOUNTER — Other Ambulatory Visit

## 2024-03-15 ENCOUNTER — Other Ambulatory Visit: Payer: Self-pay | Admitting: *Deleted

## 2024-03-15 ENCOUNTER — Telehealth: Payer: Self-pay

## 2024-03-15 DIAGNOSIS — E1169 Type 2 diabetes mellitus with other specified complication: Secondary | ICD-10-CM

## 2024-03-15 NOTE — Telephone Encounter (Signed)
 Copied from CRM [id number]. Topic: Clinical - Request for Lab/Test Order >> Mar 15, 2024 11:42 AM David Cole wrote: Pt calling to request a order, CT cardiac scoring. Pt would like to go to the [facility] imaging. Pt states he spoke Metheney about it and she told him it is an option.   Please advise. CB.

## 2024-03-21 ENCOUNTER — Ambulatory Visit (INDEPENDENT_AMBULATORY_CARE_PROVIDER_SITE_OTHER): Payer: Self-pay

## 2024-03-21 DIAGNOSIS — E1169 Type 2 diabetes mellitus with other specified complication: Secondary | ICD-10-CM

## 2024-03-21 DIAGNOSIS — E785 Hyperlipidemia, unspecified: Secondary | ICD-10-CM

## 2024-03-23 ENCOUNTER — Ambulatory Visit: Payer: Self-pay | Admitting: Family Medicine

## 2024-03-23 NOTE — Progress Notes (Signed)
 David Cole, we did get your cardiac calcium  score back.  Most of the coronary arteries look great.  The 1 in particular was the left anterior descending that did show some plaque buildup around the 49th percentile with a calcium  score of 0.7.  So at this point continue with the atorvastatin  it will make a big difference in the long run to reduce your risk for heart attack and stroke I think since you are on the 20 mg dose that is reasonable approach continue to work on healthy Mediterranean diet, weight loss and regular exercise shooting for 30 minutes 5 days/week again to reduce your risk for heart disease over the next 10 to 20 years.  At this point we do not need to do any further intervention except for those lifestyle changes and continuing your statin.

## 2024-03-27 ENCOUNTER — Other Ambulatory Visit

## 2024-05-04 ENCOUNTER — Ambulatory Visit: Admitting: Family Medicine
